# Patient Record
Sex: Female | Born: 1950 | Race: White | Hispanic: No | Smoking: Never smoker
Health system: Southern US, Community
[De-identification: ages and names within clinical notes are randomized; demographics above are authoritative.]

## PROBLEM LIST (undated history)

## (undated) DIAGNOSIS — Z87442 Personal history of urinary calculi: Secondary | ICD-10-CM

## (undated) DIAGNOSIS — E0590 Thyrotoxicosis, unspecified without thyrotoxic crisis or storm: Secondary | ICD-10-CM

## (undated) DIAGNOSIS — T7840XA Allergy, unspecified, initial encounter: Secondary | ICD-10-CM

## (undated) DIAGNOSIS — M1712 Unilateral primary osteoarthritis, left knee: Secondary | ICD-10-CM

## (undated) DIAGNOSIS — Z8489 Family history of other specified conditions: Secondary | ICD-10-CM

## (undated) DIAGNOSIS — R1310 Dysphagia, unspecified: Secondary | ICD-10-CM

## (undated) DIAGNOSIS — R55 Syncope and collapse: Secondary | ICD-10-CM

## (undated) DIAGNOSIS — E049 Nontoxic goiter, unspecified: Secondary | ICD-10-CM

## (undated) DIAGNOSIS — K219 Gastro-esophageal reflux disease without esophagitis: Secondary | ICD-10-CM

## (undated) DIAGNOSIS — M1990 Unspecified osteoarthritis, unspecified site: Secondary | ICD-10-CM

## (undated) DIAGNOSIS — D62 Acute posthemorrhagic anemia: Secondary | ICD-10-CM

## (undated) DIAGNOSIS — H269 Unspecified cataract: Secondary | ICD-10-CM

## (undated) HISTORY — PX: SPINE SURGERY: SHX786

## (undated) HISTORY — PX: ABDOMINAL HYSTERECTOMY: SHX81

## (undated) HISTORY — DX: Unspecified osteoarthritis, unspecified site: M19.90

## (undated) HISTORY — DX: Allergy, unspecified, initial encounter: T78.40XA

## (undated) HISTORY — PX: APPENDECTOMY: SHX54

## (undated) HISTORY — PX: FRACTURE SURGERY: SHX138

## (undated) HISTORY — PX: DILATION AND CURETTAGE OF UTERUS: SHX78

## (undated) HISTORY — PX: BACK SURGERY: SHX140

## (undated) HISTORY — PX: TONSILLECTOMY: SUR1361

## (undated) HISTORY — PX: BREAST EXCISIONAL BIOPSY: SUR124

---
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
## 1980-11-22 HISTORY — PX: APPENDECTOMY: SHX54

## 1980-11-22 HISTORY — PX: ABDOMINAL HYSTERECTOMY: SHX81

## 1993-11-22 HISTORY — PX: FRACTURE SURGERY: SHX138

## 2013-10-16 ENCOUNTER — Ambulatory Visit: Payer: Self-pay | Admitting: Family Medicine

## 2013-10-16 VITALS — BP 128/78 | HR 78 | Temp 97.9°F | Resp 16 | Ht 63.75 in | Wt 154.8 lb

## 2013-10-16 DIAGNOSIS — Z1239 Encounter for other screening for malignant neoplasm of breast: Secondary | ICD-10-CM

## 2013-10-16 NOTE — Progress Notes (Signed)
[facility] [address] [place] [address] [phone]  Date:  10/16/2013   Name:  Maria Bautista   DOB:  May 25, 1951   MRN:  [id number]  PCP:  No primary provider on file.    Chief Complaint: Follow-up   History of Present Illness:  Maria Bautista is a 62 y.o. very pleasant female patient who presents with the following:  She is here today for a follow-up of a breast issue.  She recently moved here from [place] and is not yet established with a PCP.  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    She had a mild abnl on her mammogram- right breast- last year (October 2013) and followed up 6 months later (April 2013); follow-up was ok.  They told her to have a follow-up in 6 months which is now.   She did not have a diagnostic mammogram- just a regular.  She has never had breast cancer.  She did have a cyst removed from her right breast several years ago but it was benign  She is otherwise generally healthy   There are no active problems to display for this patient.   Past Medical History  Diagnosis Date  . Allergy   . Arthritis     Past Surgical History  Procedure Laterality Date  . Appendectomy    . Cesarean section    . Fracture surgery    . Abdominal hysterectomy    . Spine surgery      History  Substance Use Topics  . Smoking status: Never Smoker   . Smokeless tobacco: Not on file  . Alcohol Use: No    Family History  Problem Relation Age of Onset  . Heart disease Mother   . Hyperlipidemia Mother   . Diabetes Father     Not on File  Medication list has been reviewed and updated.  No current outpatient prescriptions on file prior to visit.   No current facility-administered medications on file prior to visit.    Review of Systems:  As per HPI- otherwise negative.   Physical Examination: Filed Vitals:   10/16/13 1215  BP: 128/78  Pulse: 78  Temp: 97.9 F (36.6 C)  Resp: 16   Filed  Vitals:   10/16/13 1215  Height: 5' 3.75" (1.619 m)  Weight: 154 lb 12.8 oz (70.217 kg)   Body mass index is 26.79 kg/(m^2). Ideal Body Weight: Weight in (lb) to have BMI = 25: 144.2  GEN: WDWN, NAD, Non-toxic, A & O x 3 HEENT: Atraumatic, Normocephalic. Neck supple. No masses, No LAD. Ears and Nose: No external deformity. CV: RRR, No M/G/R. No JVD. No thrill. No extra heart sounds. PULM: CTA B, no wheezes, crackles, rhonchi. No retractions. No resp. distress. No accessory muscle use. ABD: S, NT, ND, +BS. No rebound. No HSM. EXTR: No c/c/e NEURO Normal gait.  PSYCH: Normally interactive. Conversant. Not depressed or anxious appearing.  Calm demeanor.  Breast: normal exam bilaterally.  No masses, discharge or dimpling  Assessment and Plan: Breast cancer screening - Plan: MM Digital Screening  Made referral for mammogram. Also gave info regarding [facility] scholarship fund as she may qualify for this service.   Signed Abbe Amsterdam, MD

## 2013-10-16 NOTE — Patient Instructions (Signed)
Please let us know if you have any problems with your mammogram

## 2013-11-12 ENCOUNTER — Other Ambulatory Visit (HOSPITAL_COMMUNITY): Payer: Self-pay | Admitting: *Deleted

## 2013-11-12 DIAGNOSIS — Z853 Personal history of malignant neoplasm of breast: Secondary | ICD-10-CM

## 2013-11-20 ENCOUNTER — Ambulatory Visit (HOSPITAL_COMMUNITY)
Admission: RE | Admit: 2013-11-20 | Discharge: 2013-11-20 | Disposition: A | Discharge status: Home / Self Care | Payer: Self-pay | Source: Ambulatory Visit | Attending: Obstetrics and Gynecology | Admitting: Obstetrics and Gynecology

## 2013-11-20 ENCOUNTER — Encounter (INDEPENDENT_AMBULATORY_CARE_PROVIDER_SITE_OTHER): Payer: Self-pay

## 2013-11-20 ENCOUNTER — Encounter (HOSPITAL_COMMUNITY): Payer: Self-pay

## 2013-11-20 VITALS — BP 124/62 | Temp 98.2°F | Ht 64.0 in | Wt 153.2 lb

## 2013-11-20 DIAGNOSIS — Z1239 Encounter for other screening for malignant neoplasm of breast: Secondary | ICD-10-CM

## 2013-11-20 NOTE — Patient Instructions (Addendum)
Taught Maria Bautista how to perform BSE and gave educational materials to take home. Patient did not need a Pap smear today due to last Pap smear due to a history of a hysterectomy for benign reasons. Let patient know that she no longer needs Pap smears due to her history of a hysterectomy for benign reasons. Referred patient to the [facility] for diagnostic mammogram per recommendation. Appointment scheduled for Tuesday November 27, 2013 at 1400. Referred patient to the [facility] program. Appointment scheduled for Friday, December 07, 2013 at 1100. Patient aware of appointments and will be there. Patient has already had images from [place] sent to the [facility] [place] and has verified that they have been received. Maria Bautista verbalized understanding.  Camora Tremain, Kathaleen Maser, RN 4:10 PM

## 2013-11-20 NOTE — Progress Notes (Signed)
Patient is needing a 6 month follow up diagnostic mammogram per recommendation. Last diagnostic mammogram was March 15, 2013 in [place] recommending short term 6 month follow up.  Pap Smear:    Pap smear not completed today. Last Pap smear was November 2011 and normal per patient. Per patient has no history of an abnormal Pap smear. Patient has a history of a hysterectomy in 1982 for DUB with only one ovary remaining. No further Pap smears are needed due to her history of a hysterectomy for benign reasons. No Pap smear results in EPIC.  Physical exam: Breasts Breasts symmetrical. No skin abnormalities bilateral breasts. No nipple retraction bilateral breasts. No nipple discharge bilateral breasts. No lymphadenopathy. No lumps palpated bilateral breasts. No complaints of pain or tenderness on exam. Referred patient to the [facility] for diagnostic mammogram per recommendation. Appointment scheduled for Tuesday November 27, 2013 at 1400.  Pelvic/Bimanual No Pap smear completed today since has a history of a hysterectomy for benign reasons. Pap smear not indicated per BCCCP guidelines.

## 2013-11-20 NOTE — Addendum Note (Signed)
Encounter addended by: Saintclair Halsted, RN on: 11/20/2013  4:22 PM<BR>     Documentation filed: Visit Diagnoses

## 2013-11-27 ENCOUNTER — Ambulatory Visit
Admission: RE | Admit: 2013-11-27 | Discharge: 2013-11-27 | Disposition: A | Discharge status: Home / Self Care | Payer: No Typology Code available for payment source | Source: Ambulatory Visit | Attending: Obstetrics and Gynecology | Admitting: Obstetrics and Gynecology

## 2013-11-27 ENCOUNTER — Other Ambulatory Visit (HOSPITAL_COMMUNITY): Payer: Self-pay | Admitting: Obstetrics and Gynecology

## 2013-11-27 DIAGNOSIS — Z853 Personal history of malignant neoplasm of breast: Secondary | ICD-10-CM

## 2013-12-07 ENCOUNTER — Encounter (INDEPENDENT_AMBULATORY_CARE_PROVIDER_SITE_OTHER): Payer: Self-pay

## 2013-12-07 ENCOUNTER — Ambulatory Visit: Payer: Self-pay

## 2013-12-07 ENCOUNTER — Ambulatory Visit (HOSPITAL_BASED_OUTPATIENT_CLINIC_OR_DEPARTMENT_OTHER): Payer: Self-pay | Admitting: *Deleted

## 2013-12-07 VITALS — BP 118/78 | HR 82 | Temp 98.1°F | Resp 16 | Ht 63.25 in | Wt 149.7 lb

## 2013-12-07 DIAGNOSIS — Z0000 Encounter for general adult medical examination without abnormal findings: Secondary | ICD-10-CM

## 2013-12-07 LAB — LIPID PANEL
Cholesterol: 169 mg/dL (ref 0–200)
HDL: 50 mg/dL (ref >39)
LDL Cholesterol: 105 mg/dL — ABNORMAL HIGH (ref 0–99)
Total CHOL/HDL Ratio: 3.4 Ratio
Triglycerides: 70 mg/dL (ref <150)
VLDL: 14 mg/dL (ref 0–40)

## 2013-12-07 LAB — HEMOGLOBIN A1C
Hgb A1c MFr Bld: 5.7 % — ABNORMAL HIGH (ref <5.7)
Mean Plasma Glucose: 117 mg/dL — ABNORMAL HIGH (ref <117)

## 2013-12-07 LAB — GLUCOSE (CC13): Glucose: 97 mg/dl (ref 70–140)

## 2013-12-07 NOTE — Patient Instructions (Signed)
Discussed health assessment with patient.Discussed that mental health issues concerning separation, she has under control at the present time. If was to have problems we discussed options available. Patient verbalized understanding.

## 2013-12-07 NOTE — Progress Notes (Signed)
Patient is a new patient to the [facility] program and is currently a BCCCP patient effective 10/16/2013.   Clinical Measurements: Patient is 5 ft. 3.25 inches, weight 149.7 lbs, waist circumference 332.5 inches, and hip circumference 40 inches.   Medical History: Patient states she does have a history of high cholesterol and doesn't take any medications now, because last time checked it was normal. Patient does not have a history of hypertension or diabetes and does not take any medications. Per patient no diagnosed history of coronary heart disease, heart attack, heart failure, stroke/TIA, vascular disease or congenital heart defects. Patientt states that will have health insurance in a month or two  Blood Pressure, Self-measurement: Patient does not have any history of hypertension and does not need to know how to check BP at the present time.  Nutrition Assessment: Patient states  she loves fruit and has at least two to three cups of fruit a day.Patient states she only eats a half cup of vegetables daily. Patient states does eat 3 or more ounces of whole grains daily. Patient doesn't eat two or more servings of fish weekly. Patient does state she Does not drink more than 36 ounces or 450 calories of beverages with added sugars weekly. Patient stated she does not watch her salt intake.   Physical Activity Assessment: Patient stated she does around 180 minutes of moderate exercise weekly including walking and household chores. Patient stated she does a 180 minutes of vigorous physical activity on a regular basis per week.   Smoking Status: Patient has never smoked.  Quality of Life Assessment: In assessing patient's quality of life she stated that out of the past 30 days that she has felt her health is good . Patient also stated that in the past 30 days that her mental health ihas not been good 2 times due to thinking about things in [place] and going back. Patient is fairly recently separated from  husband and still loves him, but can not live in same situation. Patient did state that out of the past 30 days that she not felt any physical or mental health that would keep her from doing her usual activities including self-care, work or recreation.   Plan: Lab work will be done today including a lipid panel, glucose and Hgb A1c. Will follow up with patient concerning lab work. If needed will refer to [facility] for abnormal labs or do health coaching.  Patient examined and note written by Carlean Jews, RN, BSN

## 2013-12-11 ENCOUNTER — Telehealth (HOSPITAL_COMMUNITY): Payer: Self-pay

## 2013-12-11 NOTE — Telephone Encounter (Signed)
Called and informed the patient of her lab work: Bld Glucose - 97, HgbA1C - 5.7, mean - 117, Cholesterol - 169, HDL - 50, LDL - 105, Triglycerides - 70. We discussed watching Carbohydrates and sweets. Patient will get a PCP and have her HbgA1C checked.  Stated that understood and will call if has any problems.

## 2014-05-13 ENCOUNTER — Ambulatory Visit (INDEPENDENT_AMBULATORY_CARE_PROVIDER_SITE_OTHER): Payer: 59 | Admitting: Family Medicine

## 2014-05-13 ENCOUNTER — Other Ambulatory Visit: Payer: Self-pay | Admitting: Family Medicine

## 2014-05-13 ENCOUNTER — Encounter: Payer: Self-pay | Admitting: Family Medicine

## 2014-05-13 VITALS — BP 122/80 | HR 97 | Temp 98.0°F | Resp 16 | Ht 62.5 in | Wt 148.0 lb

## 2014-05-13 DIAGNOSIS — R921 Mammographic calcification found on diagnostic imaging of breast: Secondary | ICD-10-CM

## 2014-05-13 DIAGNOSIS — E875 Hyperkalemia: Secondary | ICD-10-CM

## 2014-05-13 DIAGNOSIS — Z1329 Encounter for screening for other suspected endocrine disorder: Secondary | ICD-10-CM

## 2014-05-13 DIAGNOSIS — R0982 Postnasal drip: Secondary | ICD-10-CM

## 2014-05-13 DIAGNOSIS — R739 Hyperglycemia, unspecified: Secondary | ICD-10-CM

## 2014-05-13 DIAGNOSIS — R7309 Other abnormal glucose: Secondary | ICD-10-CM

## 2014-05-13 LAB — COMPREHENSIVE METABOLIC PANEL
ALT: 20 U/L (ref 0–35)
AST: 23 U/L (ref 0–37)
Albumin: 4.5 g/dL (ref 3.5–5.2)
Alkaline Phosphatase: 63 U/L (ref 39–117)
BUN: 8 mg/dL (ref 6–23)
CO2: 27 mEq/L (ref 19–32)
Calcium: 10.4 mg/dL (ref 8.4–10.5)
Chloride: 100 mEq/L (ref 96–112)
Creat: 0.78 mg/dL (ref 0.50–1.10)
Glucose, Bld: 101 mg/dL — ABNORMAL HIGH (ref 70–99)
Potassium: 5.4 mEq/L — ABNORMAL HIGH (ref 3.5–5.3)
Sodium: 133 mEq/L — ABNORMAL LOW (ref 135–145)
Total Bilirubin: 0.4 mg/dL (ref 0.2–1.2)
Total Protein: 7.1 g/dL (ref 6.0–8.3)

## 2014-05-13 LAB — CBC
HCT: 37.3 % (ref 36.0–46.0)
Hemoglobin: 12.5 g/dL (ref 12.0–15.0)
MCH: 28.7 pg (ref 26.0–34.0)
MCHC: 33.5 g/dL (ref 30.0–36.0)
MCV: 85.7 fL (ref 78.0–100.0)
Platelets: 364 10*3/uL (ref 150–400)
RBC: 4.35 MIL/uL (ref 3.87–5.11)
RDW: 12.8 % (ref 11.5–15.5)
WBC: 4.8 10*3/uL (ref 4.0–10.5)

## 2014-05-13 LAB — HEMOGLOBIN A1C
Hgb A1c MFr Bld: 5.8 % — ABNORMAL HIGH (ref <5.7)
Mean Plasma Glucose: 120 mg/dL — ABNORMAL HIGH (ref <117)

## 2014-05-13 LAB — TSH: TSH: 0.361 u[IU]/mL (ref 0.350–4.500)

## 2014-05-13 MED ORDER — IPRATROPIUM BROMIDE 0.03 % NA SOLN: 2.0000 | Freq: Four times a day (QID) | NASAL | Status: DC

## 2014-05-13 NOTE — Patient Instructions (Signed)
Great to see you today I will be in touch with your labs Go ahead and call to set up your repeat mammogram Try the atrovent nasal spray for your allergies- let me know if not helpful for you

## 2014-05-13 NOTE — Progress Notes (Signed)
[facility] [address], [place] [address] [phone]  Date:  05/13/2014   Name:  Maria Bautista   DOB:  August 05, 1951   MRN:  [id number]  PCP:  No primary provider on file.    Chief Complaint: Advice Only   History of Present Illness:  Maria Bautista is a 63 y.o. very pleasant female patient who presents with the following:  She is here today to follow-up her mammogram- she had one in January and needs a follow-up diagnostic.   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  She has used claritin and claritin D.  Right now she is using zyrtec in the am and claritin at night.   She has PND, some drainage, little sneezing. She will have a horse voice some of the time.  "my nose will run like a river.'   She takes spironolactone for her hair She is not fasting today- she had a FLP about 6 months ago and her A1c was 5.7% at that time.    There are no active problems to display for this patient.   Past Medical History  Diagnosis Date  . Allergy   . Arthritis     Past Surgical History  Procedure Laterality Date  . Appendectomy    . Cesarean section    . Fracture surgery    . Abdominal hysterectomy    . Spine surgery      History  Substance Use Topics  . Smoking status: Never Smoker   . Smokeless tobacco: Not on file  . Alcohol Use: No    Family History  Problem Relation Age of Onset  . Heart disease Mother   . Hyperlipidemia Mother   . Diabetes Father     Allergies  Allergen Reactions  . Codeine     Medication list has been reviewed and updated.  Current Outpatient Prescriptions on File Prior to Visit  Medication Sig Dispense Refill  . aspirin 81 MG tablet Take 81 mg by mouth daily.      . cetirizine-pseudoephedrine (ZYRTEC-D) 5-120 MG per tablet Take 1 tablet by mouth 2 (two) times daily.      Marland Kitchen omeprazole (PRILOSEC) 10 MG capsule Take 10 mg by mouth daily.      Marland Kitchen spironolactone (ALDACTONE) 100 MG tablet Take 100 mg by mouth daily.        No current facility-administered medications on file prior to visit.    Review of Systems:  As per HPI- otherwise negative.   Physical Examination: Filed Vitals:   05/13/14 0930  BP: 122/80  Pulse: 97  Temp: 98 F (36.7 C)  Resp: 16   Filed Vitals:   05/13/14 0930  Height: 5' 2.5" (1.588 m)  Weight: 148 lb (67.132 kg)   Body mass index is 26.62 kg/(m^2). Ideal Body Weight: Weight in (lb) to have BMI = 25: 138.6  GEN: WDWN, NAD, Non-toxic, A & O x , looks well, mild overweight HEENT: Atraumatic, Normocephalic. Neck supple. No masses, No LAD.  Bilateral TM wnl, oropharynx normal.  PEERL,EOMI.   Stringy nasal mucus typical of AR Ears and Nose: No external deformity. CV: RRR, No M/G/R. No JVD. No thrill. No extra heart sounds. PULM: CTA B, no wheezes, crackles, rhonchi. No retractions. No resp. distress. No accessory muscle use. EXTR: No c/c/e NEURO Normal gait.  PSYCH: Normally interactive. Conversant. Not depressed or anxious appearing.  Calm demeanor.    Assessment and Plan: PND (post-nasal drip) - Plan: ipratropium (ATROVENT) 0.03 %  nasal spray  Elevated blood sugar - Plan: CBC, Comprehensive metabolic panel, Hemoglobin A1c  Screening for hypothyroidism - Plan: TSH  Per letter from her imaging facility she can call to schedule FU.  She will do so Added atrovent nasal for what sounds like chronic PND.  She will let me know if not better.   Signed Lamar Blinks, MD

## 2014-05-14 ENCOUNTER — Encounter: Payer: Self-pay | Admitting: Family Medicine

## 2014-05-14 NOTE — Addendum Note (Signed)
Addended by: Lamar Blinks C on: 05/14/2014 02:14 PM   Modules accepted: Orders

## 2014-05-28 ENCOUNTER — Ambulatory Visit
Admission: RE | Admit: 2014-05-28 | Discharge: 2014-05-28 | Disposition: A | Discharge status: Home / Self Care | Payer: Self-pay | Source: Ambulatory Visit | Attending: Family Medicine | Admitting: Family Medicine

## 2014-05-28 ENCOUNTER — Other Ambulatory Visit: Payer: Self-pay | Admitting: Family Medicine

## 2014-05-28 DIAGNOSIS — R921 Mammographic calcification found on diagnostic imaging of breast: Secondary | ICD-10-CM

## 2014-09-23 ENCOUNTER — Encounter: Payer: Self-pay | Admitting: Family Medicine

## 2014-12-03 ENCOUNTER — Other Ambulatory Visit: Payer: Self-pay | Admitting: Family Medicine

## 2014-12-03 DIAGNOSIS — Z1231 Encounter for screening mammogram for malignant neoplasm of breast: Secondary | ICD-10-CM

## 2014-12-17 ENCOUNTER — Other Ambulatory Visit: Payer: Self-pay | Admitting: Family Medicine

## 2014-12-17 DIAGNOSIS — R921 Mammographic calcification found on diagnostic imaging of breast: Secondary | ICD-10-CM

## 2014-12-18 ENCOUNTER — Inpatient Hospital Stay: Admission: RE | Admit: 2014-12-18 | Payer: Self-pay | Source: Ambulatory Visit

## 2014-12-25 ENCOUNTER — Ambulatory Visit
Admission: RE | Admit: 2014-12-25 | Discharge: 2014-12-25 | Disposition: A | Discharge status: Home / Self Care | Payer: Medicare Other | Source: Ambulatory Visit | Attending: Family Medicine | Admitting: Family Medicine

## 2014-12-25 DIAGNOSIS — R921 Mammographic calcification found on diagnostic imaging of breast: Secondary | ICD-10-CM | POA: Diagnosis not present

## 2015-02-17 ENCOUNTER — Encounter: Payer: Self-pay | Admitting: Family Medicine

## 2015-02-17 ENCOUNTER — Ambulatory Visit (INDEPENDENT_AMBULATORY_CARE_PROVIDER_SITE_OTHER): Payer: Medicare Other | Admitting: Family Medicine

## 2015-02-17 VITALS — BP 110/72 | HR 82 | Temp 98.0°F | Resp 16 | Ht 64.0 in | Wt 145.2 lb

## 2015-02-17 DIAGNOSIS — L659 Nonscarring hair loss, unspecified: Secondary | ICD-10-CM | POA: Insufficient documentation

## 2015-02-17 DIAGNOSIS — E785 Hyperlipidemia, unspecified: Secondary | ICD-10-CM | POA: Diagnosis not present

## 2015-02-17 DIAGNOSIS — R0982 Postnasal drip: Secondary | ICD-10-CM | POA: Diagnosis not present

## 2015-02-17 DIAGNOSIS — Z13220 Encounter for screening for lipoid disorders: Secondary | ICD-10-CM | POA: Diagnosis not present

## 2015-02-17 DIAGNOSIS — E875 Hyperkalemia: Secondary | ICD-10-CM

## 2015-02-17 DIAGNOSIS — R739 Hyperglycemia, unspecified: Secondary | ICD-10-CM | POA: Diagnosis not present

## 2015-02-17 LAB — LIPID PANEL
Cholesterol: 199 mg/dL (ref 0–200)
HDL: 54 mg/dL (ref >=46)
LDL Cholesterol: 128 mg/dL — ABNORMAL HIGH (ref 0–99)
Total CHOL/HDL Ratio: 3.7 Ratio
Triglycerides: 84 mg/dL (ref <150)
VLDL: 17 mg/dL (ref 0–40)

## 2015-02-17 LAB — BASIC METABOLIC PANEL
BUN: 11 mg/dL (ref 6–23)
CO2: 27 mEq/L (ref 19–32)
Calcium: 10.4 mg/dL (ref 8.4–10.5)
Chloride: 103 mEq/L (ref 96–112)
Creat: 0.67 mg/dL (ref 0.50–1.10)
Glucose, Bld: 103 mg/dL — ABNORMAL HIGH (ref 70–99)
Potassium: 5 mEq/L (ref 3.5–5.3)
Sodium: 138 mEq/L (ref 135–145)

## 2015-02-17 LAB — HEMOGLOBIN A1C
Hgb A1c MFr Bld: 5.7 % — ABNORMAL HIGH (ref <5.7)
Mean Plasma Glucose: 117 mg/dL — ABNORMAL HIGH (ref <117)

## 2015-02-17 MED ORDER — IPRATROPIUM BROMIDE 0.03 % NA SOLN: 2.0000 | Freq: Four times a day (QID) | NASAL | Status: DC

## 2015-02-17 MED ORDER — SPIRONOLACTONE 100 MG PO TABS: 100.0000 mg | ORAL_TABLET | Freq: Every day | ORAL | Status: DC

## 2015-02-17 NOTE — Patient Instructions (Signed)
Good to see you today- I will be in touch with your labs asap 

## 2015-02-17 NOTE — Progress Notes (Signed)
[facility] [address], [place] [address] [phone]  Date:  02/17/2015   Name:  Maria Bautista   DOB:  [birth year]   MRN:  [id number]  PCP:  No primary care provider on file.    Chief Complaint: blood work and Advice Only   History of Present Illness:  Maria Bautista is a 64 y.o. very pleasant female patient who presents with the following:  She is here today to recheck her labs- we needed to recheck her K and her A1c.  She is on spiro for hair loss; she started this a couple of years and and it did seem to help her keep more of her hair.  However her K was a bit elevated last year and needs recheck today.  Her A1c was borderline as well  Lab Results  Component Value Date   HGBA1C 5.8* 05/13/2014    She was given atrovent nasal last year which did seem to help her- she would like to have more of this She is fasting today and would like to check her CHL  There are no active problems to display for this patient.   Past Medical History  Diagnosis Date  . Allergy   . Arthritis     Past Surgical History  Procedure Laterality Date  . Appendectomy    . Cesarean section    . Fracture surgery    . Abdominal hysterectomy    . Spine surgery      History  Substance Use Topics  . Smoking status: Never Smoker   . Smokeless tobacco: Not on file  . Alcohol Use: No    Family History  Problem Relation Age of Onset  . Heart disease Mother   . Hyperlipidemia Mother   . Diabetes Father     Allergies  Allergen Reactions  . Codeine     Medication list has been reviewed and updated.  Current Outpatient Prescriptions on File Prior to Visit  Medication Sig Dispense Refill  . Ascorbic Acid (VITAMIN C) 100 MG tablet Take 100 mg by mouth daily.    Marland Kitchen aspirin 81 MG tablet Take 81 mg by mouth daily.    . calcium carbonate (OS-CAL) 600 MG TABS tablet Take 600 mg by mouth 2 (two) times daily with a meal.    . cetirizine-pseudoephedrine  (ZYRTEC-D) 5-120 MG per tablet Take 1 tablet by mouth 2 (two) times daily.    . cholecalciferol (VITAMIN D) 1000 UNITS tablet Take 1,000 Units by mouth daily.    Marland Kitchen ipratropium (ATROVENT) 0.03 % nasal spray Place 2 sprays into the nose 4 (four) times daily. 30 mL 6  . lactobacillus acidophilus (BACID) TABS tablet Take 2 tablets by mouth 3 (three) times daily.    Marland Kitchen loratadine (CLARITIN) 10 MG tablet Take 10 mg by mouth daily.    Marland Kitchen omeprazole (PRILOSEC) 10 MG capsule Take 10 mg by mouth daily.    Marland Kitchen spironolactone (ALDACTONE) 100 MG tablet Take 100 mg by mouth daily.     No current facility-administered medications on file prior to visit.    Review of Systems:  As per HPI- otherwise negative.   Physical Examination: Filed Vitals:   02/17/15 0809  BP: 110/72  Pulse: 82  Temp: 98 F (36.7 C)  Resp: 16   Filed Vitals:   02/17/15 0809  Height: 5\' 4"  (1.626 m)  Weight: 145 lb 3.2 oz (65.862 kg)   Body mass index is 24.91 kg/(m^2). Ideal Body Weight: Weight  in (lb) to have BMI = 25: 145.3  GEN: WDWN, NAD, Non-toxic, A & O x 3, looks well, normal weight HEENT: Atraumatic, Normocephalic. Neck supple. No masses, No LAD. Ears and Nose: No external deformity. CV: RRR, No M/G/R. No JVD. No thrill. No extra heart sounds. PULM: CTA B, no wheezes, crackles, rhonchi. No retractions. No resp. distress. No accessory muscle use. EXTR: No c/c/e NEURO Normal gait.  PSYCH: Normally interactive. Conversant. Not depressed or anxious appearing.  Calm demeanor.    Assessment and Plan: Borderline hyperglycemia - Plan: Hemoglobin A1c  Hyperkalemia - Plan: Basic metabolic panel  PND (post-nasal drip) - Plan: ipratropium (ATROVENT) 0.03 % nasal spray  Thinning hair - Plan: spironolactone (ALDACTONE) 100 MG tablet  Screening for hyperlipidemia - Plan: Lipid panel  Refilled her spiro and atrovent nasal Will plan further follow- up pending labs. Explained that spiro is a k sparing diuretic; thus  can increase her K level  Signed Lamar Blinks, MD

## 2015-02-28 ENCOUNTER — Encounter: Payer: Self-pay | Admitting: Family Medicine

## 2015-04-03 ENCOUNTER — Ambulatory Visit (INDEPENDENT_AMBULATORY_CARE_PROVIDER_SITE_OTHER): Payer: Medicare Other | Admitting: Physician Assistant

## 2015-04-03 ENCOUNTER — Ambulatory Visit (INDEPENDENT_AMBULATORY_CARE_PROVIDER_SITE_OTHER): Payer: Medicare Other

## 2015-04-03 VITALS — BP 132/74 | HR 98 | Temp 98.0°F | Resp 13 | Ht 63.0 in | Wt 149.6 lb

## 2015-04-03 DIAGNOSIS — R05 Cough: Secondary | ICD-10-CM | POA: Diagnosis not present

## 2015-04-03 DIAGNOSIS — J019 Acute sinusitis, unspecified: Secondary | ICD-10-CM | POA: Diagnosis not present

## 2015-04-03 DIAGNOSIS — R058 Other specified cough: Secondary | ICD-10-CM

## 2015-04-03 LAB — POCT CBC
Granulocyte percent: 77.5 %G (ref 37–80)
HCT, POC: 39.4 % (ref 37.7–47.9)
Hemoglobin: 12.7 g/dL (ref 12.2–16.2)
Lymph, poc: 1.9 (ref 0.6–3.4)
MCH, POC: 28 pg (ref 27–31.2)
MCHC: 32.2 g/dL (ref 31.8–35.4)
MCV: 87 fL (ref 80–97)
MID (cbc): 0.6 (ref 0–0.9)
MPV: 6.8 fL (ref 0–99.8)
POC Granulocyte: 8.4 — AB (ref 2–6.9)
POC LYMPH PERCENT: 17.3 %L (ref 10–50)
POC MID %: 5.2 %M (ref 0–12)
Platelet Count, POC: 413 10*3/uL (ref 142–424)
RBC: 4.53 M/uL (ref 4.04–5.48)
RDW, POC: 13 %
WBC: 10.8 10*3/uL — AB (ref 4.6–10.2)

## 2015-04-03 MED ORDER — LEVOFLOXACIN 500 MG PO TABS: 500.0000 mg | ORAL_TABLET | Freq: Every day | ORAL | Status: AC

## 2015-04-03 NOTE — Progress Notes (Signed)
[facility] and [facility] [address] [place] [address] [phone]  Date:  04/03/2015   Name:  Maria Bautista   DOB:  09-10-1951   MRN:  [id number]  PCP:  No primary care provider on file.    Chief Complaint: Nasal Congestion; Cough; and Allergies   History of Present Illness:  Maria Bautista is a 64 y.o. very pleasant female patient who presents with the following:  Patient is complaining of 2 weeks of sinus pressure, and a progressive worsening cough of a green thick sputum.  She has nasal pressure and rhinorrhea.  Cough is keeping her up at night.  She feels fatigue, but no fever or chills.  She has tried alka seltzer cold and sinus, and zyrtec which has helped.  She has no GI symptoms of nausea, vomiting, or diarrhea.   Patient Active Problem List   Diagnosis Date Noted  . Thinning hair 02/17/2015  . Borderline hyperglycemia 02/17/2015    Past Medical History  Diagnosis Date  . Allergy   . Arthritis     Past Surgical History  Procedure Laterality Date  . Appendectomy    . Cesarean section    . Fracture surgery    . Abdominal hysterectomy    . Spine surgery      History  Substance Use Topics  . Smoking status: Never Smoker   . Smokeless tobacco: Not on file  . Alcohol Use: No    Family History  Problem Relation Age of Onset  . Heart disease Mother   . Hyperlipidemia Mother   . Diabetes Father     Allergies  Allergen Reactions  . Codeine     Medication list has been reviewed and updated.  Current Outpatient Prescriptions on File Prior to Visit  Medication Sig Dispense Refill  . Ascorbic Acid (VITAMIN C) 100 MG tablet Take 100 mg by mouth daily.    Marland Kitchen aspirin 81 MG tablet Take 81 mg by mouth daily.    . calcium carbonate (OS-CAL) 600 MG TABS tablet Take 600 mg by mouth 2 (two) times daily with a meal.    . cetirizine-pseudoephedrine (ZYRTEC-D) 5-120 MG per tablet Take 1 tablet by mouth 2 (two) times daily.    . cholecalciferol (VITAMIN  D) 1000 UNITS tablet Take 1,000 Units by mouth daily.    Marland Kitchen ipratropium (ATROVENT) 0.03 % nasal spray Place 2 sprays into the nose 4 (four) times daily. 30 mL 6  . lactobacillus acidophilus (BACID) TABS tablet Take 2 tablets by mouth 3 (three) times daily.    Marland Kitchen loratadine (CLARITIN) 10 MG tablet Take 10 mg by mouth daily.    Marland Kitchen omeprazole (PRILOSEC) 10 MG capsule Take 10 mg by mouth daily.    Marland Kitchen spironolactone (ALDACTONE) 100 MG tablet Take 1 tablet (100 mg total) by mouth daily. 90 tablet 3   No current facility-administered medications on file prior to visit.    Review of Systems: ROS otherwise unremarkable unless listed above.  Physical Examination: Filed Vitals:   04/03/15 1551  BP: 132/74  Pulse: 98  Temp: 98 F (36.7 C)  Resp: 13   Filed Vitals:   04/03/15 1551  Height: 5\' 3"  (1.6 m)  Weight: 149 lb 9.6 oz (67.858 kg)   Body mass index is 26.51 kg/(m^2). Ideal Body Weight: Weight in (lb) to have BMI = 25: 140.8 Physical Exam  Constitutional: She is oriented to person, place, and time. She appears well-developed and well-nourished. No distress.  HENT:  Head:  Normocephalic and atraumatic.  Eyes: EOM are normal. Pupils are equal, round, and reactive to light.  Cardiovascular: Normal rate, regular rhythm and normal heart sounds.  Exam reveals no friction rub.   No murmur heard. Pulmonary/Chest: Effort normal and breath sounds normal. No respiratory distress. She has no decreased breath sounds. She has no wheezes. She has no rhonchi.  Lymphadenopathy:       Head (right side): No submandibular, no tonsillar, no preauricular, no posterior auricular and no occipital adenopathy present.       Head (left side): No submandibular, no tonsillar, no preauricular, no posterior auricular and no occipital adenopathy present.    She has no cervical adenopathy.       Right: No supraclavicular adenopathy present.       Left: No supraclavicular adenopathy present.  Neurological: She is alert  and oriented to person, place, and time.  Skin: Skin is warm and dry. She is not diaphoretic.  Psychiatric: She has a normal mood and affect. Her behavior is normal.    Results for orders placed or performed in visit on 04/03/15  POCT CBC  Result Value Ref Range   WBC 10.8 (A) 4.6 - 10.2 K/uL   Lymph, poc 1.9 0.6 - 3.4   POC LYMPH PERCENT 17.3 10 - 50 %L   MID (cbc) 0.6 0 - 0.9   POC MID % 5.2 0 - 12 %M   POC Granulocyte 8.4 (A) 2 - 6.9   Granulocyte percent 77.5 37 - 80 %G   RBC 4.53 4.04 - 5.48 M/uL   Hemoglobin 12.7 12.2 - 16.2 g/dL   HCT, POC 39.4 37.7 - 47.9 %   MCV 87.0 80 - 97 fL   MCH, POC 28.0 27 - 31.2 pg   MCHC 32.2 31.8 - 35.4 g/dL   RDW, POC 13.0 %   Platelet Count, POC 413 142 - 424 K/uL   MPV 6.8 0 - 99.8 fL   [facility] reading (PRIMARY) by  Dr. Ouida Sills: Normal  Assessment and Plan: 64 year old female is here today for chief complaint of productive cough, nasal pressure, and allergies for 2 weeks.   -Likely sinus infection of bacterial etiology, pneumonia.  Will treat for both.  She does not want anything for cough.  Offered delsym otc, and mucinex.  Productive cough - Plan: POCT CBC, DG Chest 2 View, levofloxacin (LEVAQUIN) 500 MG tablet qd for 7 days  Acute sinusitis, recurrence not specified, unspecified location - Plan: levofloxacin (LEVAQUIN) 500 MG tablet  Ivar Drape, PA-C [facility] 5/13/20161:53 PM

## 2015-04-03 NOTE — Patient Instructions (Signed)
Please hydrate with 64 oz of water (about 4 regular sized water bottles). Mucinex will thin out the mucus.   Sinusitis Sinusitis is redness, soreness, and inflammation of the paranasal sinuses. Paranasal sinuses are air pockets within the bones of your face (beneath the eyes, the middle of the forehead, or above the eyes). In healthy paranasal sinuses, mucus is able to drain out, and air is able to circulate through them by way of your nose. However, when your paranasal sinuses are inflamed, mucus and air can become trapped. This can allow bacteria and other germs to grow and cause infection. Sinusitis can develop quickly and last only a short time (acute) or continue over a long period (chronic). Sinusitis that lasts for more than 12 weeks is considered chronic.  CAUSES  Causes of sinusitis include:  Allergies.  Structural abnormalities, such as displacement of the cartilage that separates your nostrils (deviated septum), which can decrease the air flow through your nose and sinuses and affect sinus drainage.  Functional abnormalities, such as when the small hairs (cilia) that line your sinuses and help remove mucus do not work properly or are not present. SIGNS AND SYMPTOMS  Symptoms of acute and chronic sinusitis are the same. The primary symptoms are pain and pressure around the affected sinuses. Other symptoms include:  Upper toothache.  Earache.  Headache.  Bad breath.  Decreased sense of smell and taste.  A cough, which worsens when you are lying flat.  Fatigue.  Fever.  Thick drainage from your nose, which often is green and may contain pus (purulent).  Swelling and warmth over the affected sinuses. DIAGNOSIS  Your health care provider will perform a physical exam. During the exam, your health care provider may:  Look in your nose for signs of abnormal growths in your nostrils (nasal polyps).  Tap over the affected sinus to check for signs of infection.  View the  inside of your sinuses (endoscopy) using an imaging device that has a light attached (endoscope). If your health care provider suspects that you have chronic sinusitis, one or more of the following tests may be recommended:  Allergy tests.  Nasal culture. A sample of mucus is taken from your nose, sent to a lab, and screened for bacteria.  Nasal cytology. A sample of mucus is taken from your nose and examined by your health care provider to determine if your sinusitis is related to an allergy. TREATMENT  Most cases of acute sinusitis are related to a viral infection and will resolve on their own within 10 days. Sometimes medicines are prescribed to help relieve symptoms (pain medicine, decongestants, nasal steroid sprays, or saline sprays).  However, for sinusitis related to a bacterial infection, your health care provider will prescribe antibiotic medicines. These are medicines that will help kill the bacteria causing the infection.  Rarely, sinusitis is caused by a fungal infection. In theses cases, your health care provider will prescribe antifungal medicine. For some cases of chronic sinusitis, surgery is needed. Generally, these are cases in which sinusitis recurs more than 3 times per year, despite other treatments. HOME CARE INSTRUCTIONS   Drink plenty of water. Water helps thin the mucus so your sinuses can drain more easily.  Use a humidifier.  Inhale steam 3 to 4 times a day (for example, sit in the bathroom with the shower running).  Apply a warm, moist washcloth to your face 3 to 4 times a day, or as directed by your health care provider.  Use saline  nasal sprays to help moisten and clean your sinuses.  Take medicines only as directed by your health care provider.  If you were prescribed either an antibiotic or antifungal medicine, finish it all even if you start to feel better. SEEK IMMEDIATE MEDICAL CARE IF:  You have increasing pain or severe headaches.  You have  nausea, vomiting, or drowsiness.  You have swelling around your face.  You have vision problems.  You have a stiff neck.  You have difficulty breathing. MAKE SURE YOU:   Understand these instructions.  Will watch your condition.  Will get help right away if you are not doing well or get worse. Document Released: 11/08/2005 Document Revised: 03/25/2014 Document Reviewed: 11/23/2011 [facility] Patient Information 2015 [place], [place]. This information is not intended to replace advice given to you by your health care provider. Make sure you discuss any questions you have with your health care provider.

## 2015-04-08 NOTE — Progress Notes (Signed)
  Medical screening examination/treatment/procedure(s) were performed by non-physician practitioner and as supervising physician I was immediately available for consultation/collaboration.     

## 2015-05-13 ENCOUNTER — Telehealth: Payer: Self-pay

## 2015-05-13 DIAGNOSIS — M8580 Other specified disorders of bone density and structure, unspecified site: Secondary | ICD-10-CM

## 2015-05-13 NOTE — Telephone Encounter (Signed)
Patient wanted to let Dr Lorelei Pont know she got her shingles shot on 05/11/15 and her Pneumonia 13 shot on 04/13/15. She is requesting Dr copland to put in an order for her to get her bone Density. Patient is wanting to have it done between August 2-5th. Patients call back number is [phone]

## 2015-06-27 ENCOUNTER — Telehealth: Payer: Self-pay

## 2015-06-27 NOTE — Telephone Encounter (Signed)
Called patient to possibly re screen and found out that patient is now 56 and has medical insurance.

## 2015-06-30 ENCOUNTER — Ambulatory Visit
Admission: RE | Admit: 2015-06-30 | Discharge: 2015-06-30 | Disposition: A | Discharge status: Home / Self Care | Payer: Medicare Other | Source: Ambulatory Visit | Attending: Family Medicine | Admitting: Family Medicine

## 2015-06-30 ENCOUNTER — Encounter: Payer: Self-pay | Admitting: Family Medicine

## 2015-06-30 DIAGNOSIS — Z780 Asymptomatic menopausal state: Secondary | ICD-10-CM | POA: Diagnosis not present

## 2015-06-30 DIAGNOSIS — M8588 Other specified disorders of bone density and structure, other site: Secondary | ICD-10-CM | POA: Diagnosis not present

## 2015-06-30 DIAGNOSIS — M85852 Other specified disorders of bone density and structure, left thigh: Secondary | ICD-10-CM | POA: Diagnosis not present

## 2015-06-30 DIAGNOSIS — M8580 Other specified disorders of bone density and structure, unspecified site: Secondary | ICD-10-CM

## 2015-07-02 ENCOUNTER — Encounter: Payer: Self-pay | Admitting: Family Medicine

## 2015-07-09 DIAGNOSIS — H2513 Age-related nuclear cataract, bilateral: Secondary | ICD-10-CM | POA: Diagnosis not present

## 2015-07-29 ENCOUNTER — Ambulatory Visit (INDEPENDENT_AMBULATORY_CARE_PROVIDER_SITE_OTHER): Payer: Medicare Other | Admitting: Family Medicine

## 2015-07-29 VITALS — BP 122/64 | HR 86 | Temp 98.2°F | Resp 16 | Ht 62.5 in | Wt 150.6 lb

## 2015-07-29 DIAGNOSIS — R3915 Urgency of urination: Secondary | ICD-10-CM | POA: Diagnosis not present

## 2015-07-29 DIAGNOSIS — Z130 Encounter for screening for diseases of the blood and blood-forming organs and certain disorders involving the immune mechanism: Secondary | ICD-10-CM | POA: Diagnosis not present

## 2015-07-29 DIAGNOSIS — Z8639 Personal history of other endocrine, nutritional and metabolic disease: Secondary | ICD-10-CM

## 2015-07-29 DIAGNOSIS — Z711 Person with feared health complaint in whom no diagnosis is made: Secondary | ICD-10-CM

## 2015-07-29 DIAGNOSIS — D2262 Melanocytic nevi of left upper limb, including shoulder: Secondary | ICD-10-CM | POA: Diagnosis not present

## 2015-07-29 DIAGNOSIS — L814 Other melanin hyperpigmentation: Secondary | ICD-10-CM | POA: Diagnosis not present

## 2015-07-29 DIAGNOSIS — M1990 Unspecified osteoarthritis, unspecified site: Secondary | ICD-10-CM

## 2015-07-29 DIAGNOSIS — R7302 Impaired glucose tolerance (oral): Secondary | ICD-10-CM

## 2015-07-29 DIAGNOSIS — M19049 Primary osteoarthritis, unspecified hand: Secondary | ICD-10-CM

## 2015-07-29 LAB — CBC
HCT: 36.2 % (ref 36.0–46.0)
Hemoglobin: 12 g/dL (ref 12.0–15.0)
MCH: 28.5 pg (ref 26.0–34.0)
MCHC: 33.1 g/dL (ref 30.0–36.0)
MCV: 86 fL (ref 78.0–100.0)
MPV: 9.1 fL (ref 8.6–12.4)
Platelets: 315 10*3/uL (ref 150–400)
RBC: 4.21 MIL/uL (ref 3.87–5.11)
RDW: 13.2 % (ref 11.5–15.5)
WBC: 4.5 10*3/uL (ref 4.0–10.5)

## 2015-07-29 LAB — COMPREHENSIVE METABOLIC PANEL
ALT: 17 U/L (ref 6–29)
AST: 22 U/L (ref 10–35)
Albumin: 4.5 g/dL (ref 3.6–5.1)
Alkaline Phosphatase: 60 U/L (ref 33–130)
BUN: 17 mg/dL (ref 7–25)
CO2: 24 mmol/L (ref 20–31)
Calcium: 10.6 mg/dL — ABNORMAL HIGH (ref 8.6–10.4)
Chloride: 103 mmol/L (ref 98–110)
Creat: 0.7 mg/dL (ref 0.50–0.99)
Glucose, Bld: 92 mg/dL (ref 65–99)
Potassium: 5.1 mmol/L (ref 3.5–5.3)
Sodium: 135 mmol/L (ref 135–146)
Total Bilirubin: 0.5 mg/dL (ref 0.2–1.2)
Total Protein: 6.8 g/dL (ref 6.1–8.1)

## 2015-07-29 LAB — POCT URINALYSIS DIPSTICK
Bilirubin, UA: NEGATIVE
Glucose, UA: NEGATIVE
Nitrite, UA: NEGATIVE
Protein, UA: NEGATIVE
Spec Grav, UA: 1.02
Urobilinogen, UA: 0.2
pH, UA: 5

## 2015-07-29 LAB — POCT UA - MICROSCOPIC ONLY
Casts, Ur, LPF, POC: NEGATIVE
Crystals, Ur, HPF, POC: NEGATIVE
Mucus, UA: NEGATIVE
Yeast, UA: NEGATIVE

## 2015-07-29 LAB — HEMOGLOBIN A1C
Hgb A1c MFr Bld: 5.7 % — ABNORMAL HIGH (ref <5.7)
Mean Plasma Glucose: 117 mg/dL — ABNORMAL HIGH (ref <117)

## 2015-07-29 MED ORDER — OXYBUTYNIN CHLORIDE 5 MG PO TABS: 5.0000 mg | ORAL_TABLET | Freq: Two times a day (BID) | ORAL | Status: DC

## 2015-07-29 NOTE — Patient Instructions (Addendum)
I will be in touch with your skin biopsy results Please see [place] in about 10 days to remove the stitch from your arm If you have any sign of infection such as redness, heat, swelling let me know right away!    I will also be in touch with your labs We will refer you to a rheumatologist to make sure there is no concern for RA I do not see any concerning finding in your breast, but let me know if you notice any problem again  Try the ditropan as needed for your urinary urgency  WOUND CARE Please return in 10 days to have your stitches/staples removed or sooner if you have concerns. Marland Kitchen Keep area clean and dry for 24 hours. Do not remove bandage, if applied. . After 24 hours, remove bandage and wash wound gently with mild soap and warm water. Reapply a new bandage after cleaning wound, if directed. . Continue daily cleansing with soap and water until stitches/staples are removed. . Do not apply any ointments or creams to the wound while stitches/staples are in place, as this may cause delayed healing. . Notify the office if you experience any of the following signs of infection: Swelling, redness, pus drainage, streaking, fever >101.0 F . Notify the office if you experience excessive bleeding that does not stop after 15-20 minutes of constant, firm pressure.

## 2015-07-29 NOTE — Progress Notes (Addendum)
[facility] [address] [place] [address] [phone]  Date:  07/29/2015   Name:  Maria Bautista   DOB:  January 18, 1951   MRN:  [id number]  PCP:  No primary care provider on file.    Chief Complaint: Breast Problem; mole check; and medication question   History of Present Illness:  Maria Bautista is a 64 y.o. very pleasant female patient who presents with the following:  Here today with a few concerns  Last mammo in February of this year- stable, recommended dianostic in one year due to likely benign calcifcations Last week she was playing with her granddaughter and fell onto her side.  She notes some soreness in her breast since then that she wanted to be checked  She also notes a mole on her left arm- she is not sure how long it has been present. She does not think it has changed.  However it appear s a little different than her other moles so she was a little concerned.   No history of skin cancer  She also has noted some urinary leakage when she is walking especially.  She wears a liner only.  No incont with sneeze or cough- more that by the end of her walk she is dying to urinate and has to stop and pee frequently  She has noted it for about 6 months now.    We will check her labs today.    She was told that she had RA a few years back, and was treated by another doctor out of town.  However she was then re-checked by another provider and told that she did NOT have RA after all.  Her daughter recently had what sounds like a positive RF, and she wanted to be sure there is no need for concern   Patient Active Problem List   Diagnosis Date Noted  . Thinning hair 02/17/2015  . Borderline hyperglycemia 02/17/2015    Past Medical History  Diagnosis Date  . Allergy   . Arthritis     Past Surgical History  Procedure Laterality Date  . Appendectomy    . Cesarean section    . Fracture surgery    . Abdominal hysterectomy    . Spine surgery       Social History  Substance Use Topics  . Smoking status: Never Smoker   . Smokeless tobacco: None  . Alcohol Use: No    Family History  Problem Relation Age of Onset  . Heart disease Mother   . Hyperlipidemia Mother   . Diabetes Father     Allergies  Allergen Reactions  . Codeine     Medication list has been reviewed and updated.  Current Outpatient Prescriptions on File Prior to Visit  Medication Sig Dispense Refill  . Ascorbic Acid (VITAMIN C) 100 MG tablet Take 100 mg by mouth daily.    Marland Kitchen aspirin 81 MG tablet Take 81 mg by mouth daily.    . calcium carbonate (OS-CAL) 600 MG TABS tablet Take 600 mg by mouth 2 (two) times daily with a meal.    . cholecalciferol (VITAMIN D) 1000 UNITS tablet Take 1,000 Units by mouth daily.    Marland Kitchen lactobacillus acidophilus (BACID) TABS tablet Take 2 tablets by mouth 3 (three) times daily.    Marland Kitchen loratadine (CLARITIN) 10 MG tablet Take 10 mg by mouth daily.    Marland Kitchen omeprazole (PRILOSEC) 10 MG capsule Take 10 mg by mouth daily.    Marland Kitchen  spironolactone (ALDACTONE) 100 MG tablet Take 1 tablet (100 mg total) by mouth daily. 90 tablet 3  . cetirizine-pseudoephedrine (ZYRTEC-D) 5-120 MG per tablet Take 1 tablet by mouth 2 (two) times daily.    Marland Kitchen ipratropium (ATROVENT) 0.03 % nasal spray Place 2 sprays into the nose 4 (four) times daily. (Patient not taking: Reported on 07/29/2015) 30 mL 6   No current facility-administered medications on file prior to visit.    Review of Systems:  As per HPI- otherwise negative.   Physical Examination: Filed Vitals:   07/29/15 0949  BP: 122/64  Pulse: 86  Temp: 98.2 F (36.8 C)  Resp: 16   Filed Vitals:   07/29/15 0949  Height: 5' 2.5" (1.588 m)  Weight: 150 lb 9.6 oz (68.312 kg)   Body mass index is 27.09 kg/(m^2). Ideal Body Weight: Weight in (lb) to have BMI = 25: 138.6  GEN: WDWN, NAD, Non-toxic, A & O x 3, mild overweight, looks well HEENT: Atraumatic, Normocephalic. Neck supple. No masses, No LAD.   Bilateral TM wnl, oropharynx normal.  PEERL,EOMI.   Ears and Nose: No external deformity. CV: RRR, No M/G/R. No JVD. No thrill. No extra heart sounds. PULM: CTA B, no wheezes, crackles, rhonchi. No retractions. No resp. distress. No accessory muscle use. EXTR: No c/c/e NEURO Normal gait.  PSYCH: Normally interactive. Conversant. Not depressed or anxious appearing.  Calm demeanor.  Right breast: at the time of my exam she no longer noted any abnormality.  My exam is also normal with no masses or dimpling.   On the left upper outer arm there is a small brown flat mole with an asymmetrical border that is concerning her  VC obtained.  Area on left upper outer arm prepped with betadine and alcohol, anesthesia with 1& lidocaine.  Aseptic technique employed.  38mm punch, plced 1 HM suture with 5.0 prolene.  Dressed with band-aid bx to path  Results for orders placed or performed in visit on 07/29/15  POCT urinalysis dipstick  Result Value Ref Range   Color, UA yellow    Clarity, UA clear    Glucose, UA neg    Bilirubin, UA neg    Ketones, UA trace    Spec Grav, UA 1.020    Blood, UA small    pH, UA 5.0    Protein, UA neg    Urobilinogen, UA 0.2    Nitrite, UA neg    Leukocytes, UA moderate (2+) (A) Negative  POCT UA - Microscopic Only  Result Value Ref Range   WBC, Ur, HPF, POC 5-10    RBC, urine, microscopic 0-2    Bacteria, U Microscopic trace    Mucus, UA neg    Epithelial cells, urine per micros 0-1    Crystals, Ur, HPF, POC neg    Casts, Ur, LPF, POC neg    Yeast, UA neg     Assessment and Plan: Concern about female breast disease without diagnosis  Impaired glucose tolerance - Plan: Hemoglobin A1c  History of hyperkalemia - Plan: Comprehensive metabolic panel  Urinary urgency - Plan: POCT urinalysis dipstick, POCT UA - Microscopic Only, Urine culture, oxybutynin (DITROPAN) 5 MG tablet  Screening for deficiency anemia - Plan: CBC  Nevus of upper arm, left - Plan:  Dermatology pathology  Arthritis of hand - Plan: Ambulatory referral to Rheumatology  Will send out a urine culture to ensure no infection At this time her breast sx seem resolved. She will let me know of any further  concern Trial of ditropan (unless UTI) Await derm pathology Referral to rheum  Signed Lamar Blinks, MD  Received her urine culture on 9/8- it appears that she actually has a UTI.  Called in [place] for her.  Called and LMOM.  I will be in touch with the rest of her labs

## 2015-07-31 LAB — URINE CULTURE: Colony Count: 50000

## 2015-07-31 MED ORDER — NITROFURANTOIN MONOHYD MACRO 100 MG PO CAPS: 100.0000 mg | ORAL_CAPSULE | Freq: Two times a day (BID) | ORAL | Status: DC

## 2015-07-31 NOTE — Addendum Note (Signed)
Addended by: Lamar Blinks C on: 07/31/2015 03:29 PM   Modules accepted: Orders

## 2015-08-05 ENCOUNTER — Encounter: Payer: Self-pay | Admitting: Family Medicine

## 2015-08-08 ENCOUNTER — Ambulatory Visit (INDEPENDENT_AMBULATORY_CARE_PROVIDER_SITE_OTHER): Payer: Medicare Other | Admitting: Family Medicine

## 2015-08-08 VITALS — BP 120/74 | HR 73 | Temp 98.3°F | Resp 16 | Wt 148.6 lb

## 2015-08-08 DIAGNOSIS — R7303 Prediabetes: Secondary | ICD-10-CM

## 2015-08-08 DIAGNOSIS — R7309 Other abnormal glucose: Secondary | ICD-10-CM

## 2015-08-08 DIAGNOSIS — Z4802 Encounter for removal of sutures: Secondary | ICD-10-CM

## 2015-08-08 NOTE — Progress Notes (Signed)
[facility] [address], [place] [address] [phone]  Date:  08/08/2015   Name:  Maria Bautista   DOB:  03/15/1951   MRN:  [id number]  PCP:  No primary care provider on file.    Chief Complaint: suture removal   History of Present Illness:  Maria Bautista is a 64 y.o. very pleasant female patient who presents with the following:  Here today to remove suture from her left arm-I did a punch bx of a solar lentigo for her recently.  The area has healed well, no pain Also noted slightly increased serum calcium and borderline A1c She has increased her calcium supplementation a lot since she had thinning bones on recent scan- she will cut down again She is also just taking her ditropan once a day now as it was giving her dry mouth  Lab Results  Component Value Date   HGBA1C 5.7* 07/29/2015   She will come in for repeat labs in 6 months.    Patient Active Problem List   Diagnosis Date Noted  . Thinning hair 02/17/2015  . Borderline hyperglycemia 02/17/2015    Past Medical History  Diagnosis Date  . Allergy   . Arthritis     Past Surgical History  Procedure Laterality Date  . Appendectomy    . Cesarean section    . Fracture surgery    . Abdominal hysterectomy    . Spine surgery      Social History  Substance Use Topics  . Smoking status: Never Smoker   . Smokeless tobacco: None  . Alcohol Use: No    Family History  Problem Relation Age of Onset  . Heart disease Mother   . Hyperlipidemia Mother   . Diabetes Father     Allergies  Allergen Reactions  . Codeine     Medication list has been reviewed and updated.  Current Outpatient Prescriptions on File Prior to Visit  Medication Sig Dispense Refill  . Ascorbic Acid (VITAMIN C) 100 MG tablet Take 100 mg by mouth daily.    Marland Kitchen aspirin 81 MG tablet Take 81 mg by mouth daily.    . calcium carbonate (OS-CAL) 600 MG TABS tablet Take 600 mg by mouth 2 (two) times daily with a meal.    .  cetirizine-pseudoephedrine (ZYRTEC-D) 5-120 MG per tablet Take 1 tablet by mouth 2 (two) times daily.    . cholecalciferol (VITAMIN D) 1000 UNITS tablet Take 1,000 Units by mouth daily.    Marland Kitchen oxybutynin (DITROPAN) 5 MG tablet Take 1 tablet (5 mg total) by mouth 2 (two) times daily. 60 tablet 3  . spironolactone (ALDACTONE) 100 MG tablet Take 1 tablet (100 mg total) by mouth daily. 90 tablet 3   No current facility-administered medications on file prior to visit.    Review of Systems:  As per HPI- otherwise negative.   Physical Examination: Filed Vitals:   08/08/15 0933  BP: 120/74  Pulse: 73  Temp: 98.3 F (36.8 C)  Resp: 16   Filed Vitals:   08/08/15 0933  Weight: 148 lb 9.6 oz (67.405 kg)   Body mass index is 26.73 kg/(m^2). Ideal Body Weight:     GEN: WDWN, NAD, Non-toxic, Alert & Oriented x 3 HEENT: Atraumatic, Normocephalic.  Ears and Nose: No external deformity. EXTR: No clubbing/cyanosis/edema NEURO: Normal gait.  PSYCH: Normally interactive. Conversant. Not depressed or anxious appearing.  Calm demeanor.  Removed #1 HM suture from her left arm, well healed  Assessment and Plan: Visit for suture removal  Pre-diabetes - Plan: Hemoglobin A1c  Serum calcium elevated - Plan: Basic metabolic panel   Removed suture, placed lab order for 6 months   Signed Lamar Blinks, MD

## 2015-08-21 DIAGNOSIS — M19042 Primary osteoarthritis, left hand: Secondary | ICD-10-CM | POA: Diagnosis not present

## 2015-08-21 DIAGNOSIS — M19041 Primary osteoarthritis, right hand: Secondary | ICD-10-CM | POA: Diagnosis not present

## 2015-08-21 DIAGNOSIS — M79641 Pain in right hand: Secondary | ICD-10-CM | POA: Diagnosis not present

## 2015-08-21 DIAGNOSIS — M545 Low back pain: Secondary | ICD-10-CM | POA: Diagnosis not present

## 2015-08-21 DIAGNOSIS — M79642 Pain in left hand: Secondary | ICD-10-CM | POA: Diagnosis not present

## 2015-08-28 ENCOUNTER — Encounter: Payer: Self-pay | Admitting: Family Medicine

## 2015-08-28 DIAGNOSIS — M1990 Unspecified osteoarthritis, unspecified site: Secondary | ICD-10-CM | POA: Insufficient documentation

## 2015-11-20 ENCOUNTER — Other Ambulatory Visit: Payer: Self-pay | Admitting: Family Medicine

## 2015-11-20 DIAGNOSIS — R921 Mammographic calcification found on diagnostic imaging of breast: Secondary | ICD-10-CM

## 2016-01-08 ENCOUNTER — Ambulatory Visit
Admission: RE | Admit: 2016-01-08 | Discharge: 2016-01-08 | Disposition: A | Discharge status: Home / Self Care | Payer: Medicare Other | Source: Ambulatory Visit | Attending: Family Medicine | Admitting: Family Medicine

## 2016-01-08 ENCOUNTER — Other Ambulatory Visit: Payer: Self-pay | Admitting: Family Medicine

## 2016-01-08 DIAGNOSIS — R921 Mammographic calcification found on diagnostic imaging of breast: Secondary | ICD-10-CM

## 2016-01-10 ENCOUNTER — Ambulatory Visit (INDEPENDENT_AMBULATORY_CARE_PROVIDER_SITE_OTHER): Payer: Medicare Other | Admitting: Physician Assistant

## 2016-01-10 VITALS — BP 122/80 | HR 78 | Temp 98.1°F | Resp 17 | Ht 63.0 in | Wt 148.0 lb

## 2016-01-10 DIAGNOSIS — Z1159 Encounter for screening for other viral diseases: Secondary | ICD-10-CM | POA: Diagnosis not present

## 2016-01-10 DIAGNOSIS — M62838 Other muscle spasm: Secondary | ICD-10-CM

## 2016-01-10 DIAGNOSIS — M6248 Contracture of muscle, other site: Secondary | ICD-10-CM | POA: Diagnosis not present

## 2016-01-10 DIAGNOSIS — J019 Acute sinusitis, unspecified: Secondary | ICD-10-CM

## 2016-01-10 LAB — CBC
HCT: 39.5 % (ref 36.0–46.0)
Hemoglobin: 13.1 g/dL (ref 12.0–15.0)
MCH: 29 pg (ref 26.0–34.0)
MCHC: 33.2 g/dL (ref 30.0–36.0)
MCV: 87.4 fL (ref 78.0–100.0)
MPV: 9 fL (ref 8.6–12.4)
Platelets: 412 10*3/uL — ABNORMAL HIGH (ref 150–400)
RBC: 4.52 MIL/uL (ref 3.87–5.11)
RDW: 12.7 % (ref 11.5–15.5)
WBC: 6.9 10*3/uL (ref 4.0–10.5)

## 2016-01-10 LAB — HEPATITIS C ANTIBODY: HCV Ab: NEGATIVE

## 2016-01-10 MED ORDER — AMOXICILLIN 875 MG PO TABS: 875.0000 mg | ORAL_TABLET | Freq: Two times a day (BID) | ORAL | Status: DC

## 2016-01-10 MED ORDER — CYCLOBENZAPRINE HCL 5 MG PO TABS: 5.0000 mg | ORAL_TABLET | Freq: Three times a day (TID) | ORAL | Status: DC | PRN

## 2016-01-10 MED ORDER — CETIRIZINE HCL 10 MG PO TABS: 10.0000 mg | ORAL_TABLET | Freq: Every day | ORAL | Status: DC

## 2016-01-10 NOTE — Patient Instructions (Addendum)
Please hydrate well with 64 oz of water per day.   I would like you to continue the mucinex. Please continue the flonase as well.   Please perform these range of motion exercises for your neck.   Please let me know if you have no improvement of your neck pain in 10 days.    Sinusitis, Adult Sinusitis is redness, soreness, and inflammation of the paranasal sinuses. Paranasal sinuses are air pockets within the bones of your face. They are located beneath your eyes, in the middle of your forehead, and above your eyes. In healthy paranasal sinuses, mucus is able to drain out, and air is able to circulate through them by way of your nose. However, when your paranasal sinuses are inflamed, mucus and air can become trapped. This can allow bacteria and other germs to grow and cause infection. Sinusitis can develop quickly and last only a short time (acute) or continue over a long period (chronic). Sinusitis that lasts for more than 12 weeks is considered chronic. CAUSES Causes of sinusitis include:  Allergies.  Structural abnormalities, such as displacement of the cartilage that separates your nostrils (deviated septum), which can decrease the air flow through your nose and sinuses and affect sinus drainage.  Functional abnormalities, such as when the small hairs (cilia) that line your sinuses and help remove mucus do not work properly or are not present. SIGNS AND SYMPTOMS Symptoms of acute and chronic sinusitis are the same. The primary symptoms are pain and pressure around the affected sinuses. Other symptoms include:  Upper toothache.  Earache.  Headache.  Bad breath.  Decreased sense of smell and taste.  A cough, which worsens when you are lying flat.  Fatigue.  Fever.  Thick drainage from your nose, which often is green and may contain pus (purulent).  Swelling and warmth over the affected sinuses. DIAGNOSIS Your health care provider will perform a physical exam. During your  exam, your health care provider may perform any of the following to help determine if you have acute sinusitis or chronic sinusitis:  Look in your nose for signs of abnormal growths in your nostrils (nasal polyps).  Tap over the affected sinus to check for signs of infection.  View the inside of your sinuses using an imaging device that has a light attached (endoscope). If your health care provider suspects that you have chronic sinusitis, one or more of the following tests may be recommended:  Allergy tests.  Nasal culture. A sample of mucus is taken from your nose, sent to a lab, and screened for bacteria.  Nasal cytology. A sample of mucus is taken from your nose and examined by your health care provider to determine if your sinusitis is related to an allergy. TREATMENT Most cases of acute sinusitis are related to a viral infection and will resolve on their own within 10 days. Sometimes, medicines are prescribed to help relieve symptoms of both acute and chronic sinusitis. These may include pain medicines, decongestants, nasal steroid sprays, or saline sprays. However, for sinusitis related to a bacterial infection, your health care provider will prescribe antibiotic medicines. These are medicines that will help kill the bacteria causing the infection. Rarely, sinusitis is caused by a fungal infection. In these cases, your health care provider will prescribe antifungal medicine. For some cases of chronic sinusitis, surgery is needed. Generally, these are cases in which sinusitis recurs more than 3 times per year, despite other treatments. HOME CARE INSTRUCTIONS  Drink plenty of water. Water helps  thin the mucus so your sinuses can drain more easily.  Use a humidifier.  Inhale steam 3-4 times a day (for example, sit in the bathroom with the shower running).  Apply a warm, moist washcloth to your face 3-4 times a day, or as directed by your health care provider.  Use saline nasal sprays  to help moisten and clean your sinuses.  Take medicines only as directed by your health care provider.  If you were prescribed either an antibiotic or antifungal medicine, finish it all even if you start to feel better. SEEK IMMEDIATE MEDICAL CARE IF:  You have increasing pain or severe headaches.  You have nausea, vomiting, or drowsiness.  You have swelling around your face.  You have vision problems.  You have a stiff neck.  You have difficulty breathing.   This information is not intended to replace advice given to you by your health care provider. Make sure you discuss any questions you have with your health care provider.   Document Released: 11/08/2005 Document Revised: 11/29/2014 Document Reviewed: 11/23/2011 Elsevier Interactive Patient Education 2016 [employer] Cervical Strain and Sprain With Rehab Cervical strain and sprain are injuries that commonly occur with "whiplash" injuries. Whiplash occurs when the neck is forcefully whipped backward or forward, such as during a motor vehicle accident or during contact sports. The muscles, ligaments, tendons, discs, and nerves of the neck are susceptible to injury when this occurs. RISK FACTORS Risk of having a whiplash injury increases if:  Osteoarthritis of the spine.  Situations that make head or neck accidents or trauma more likely.  High-risk sports (football, rugby, wrestling, hockey, auto racing, gymnastics, diving, contact karate, or boxing).  Poor strength and flexibility of the neck.  Previous neck injury.  Poor tackling technique.  Improperly fitted or padded equipment. SYMPTOMS   Pain or stiffness in the front or back of neck or both.  Symptoms may present immediately or up to 24 hours after injury.  Dizziness, headache, nausea, and vomiting.  Muscle spasm with soreness and stiffness in the neck.  Tenderness and swelling at the injury site. PREVENTION  Learn and use proper technique (avoid  tackling with the head, spearing, and head-butting; use proper falling techniques to avoid landing on the head).  Warm up and stretch properly before activity.  Maintain physical fitness:  Strength, flexibility, and endurance.  Cardiovascular fitness.  Wear properly fitted and padded protective equipment, such as padded soft collars, for participation in contact sports. PROGNOSIS  Recovery from cervical strain and sprain injuries is dependent on the extent of the injury. These injuries are usually curable in 1 week to 3 months with appropriate treatment.  RELATED COMPLICATIONS   Temporary numbness and weakness may occur if the nerve roots are damaged, and this may persist until the nerve has completely healed.  Chronic pain due to frequent recurrence of symptoms.  Prolonged healing, especially if activity is resumed too soon (before complete recovery). TREATMENT  Treatment initially involves the use of ice and medication to help reduce pain and inflammation. It is also important to perform strengthening and stretching exercises and modify activities that worsen symptoms so the injury does not get worse. These exercises may be performed at home or with a therapist. For patients who experience severe symptoms, a soft, padded collar may be recommended to be worn around the neck.  Improving your posture may help reduce symptoms. Posture improvement includes pulling your chin and abdomen in while sitting or standing. If you are sitting,  sit in a firm chair with your buttocks against the back of the chair. While sleeping, try replacing your pillow with a small towel rolled to 2 inches in diameter, or use a cervical pillow or soft cervical collar. Poor sleeping positions delay healing.  For patients with nerve root damage, which causes numbness or weakness, the use of a cervical traction apparatus may be recommended. Surgery is rarely necessary for these injuries. However, cervical strain and sprains  that are present at birth (congenital) may require surgery. MEDICATION   If pain medication is necessary, nonsteroidal anti-inflammatory medications, such as aspirin and ibuprofen, or other minor pain relievers, such as acetaminophen, are often recommended.  Do not take pain medication for 7 days before surgery.  Prescription pain relievers may be given if deemed necessary by your caregiver. Use only as directed and only as much as you need. HEAT AND COLD:   Cold treatment (icing) relieves pain and reduces inflammation. Cold treatment should be applied for 10 to 15 minutes every 2 to 3 hours for inflammation and pain and immediately after any activity that aggravates your symptoms. Use ice packs or an ice massage.  Heat treatment may be used prior to performing the stretching and strengthening activities prescribed by your caregiver, physical therapist, or athletic trainer. Use a heat pack or a warm soak. SEEK MEDICAL CARE IF:   Symptoms get worse or do not improve in 2 weeks despite treatment.  New, unexplained symptoms develop (drugs used in treatment may produce side effects). EXERCISES RANGE OF MOTION (ROM) AND STRETCHING EXERCISES - Cervical Strain and Sprain These exercises may help you when beginning to rehabilitate your injury. In order to successfully resolve your symptoms, you must improve your posture. These exercises are designed to help reduce the forward-head and rounded-shoulder posture which contributes to this condition. Your symptoms may resolve with or without further involvement from your physician, physical therapist or athletic trainer. While completing these exercises, remember:   Restoring tissue flexibility helps normal motion to return to the joints. This allows healthier, less painful movement and activity.  An effective stretch should be held for at least 20 seconds, although you may need to begin with shorter hold times for comfort.  A stretch should never be  painful. You should only feel a gentle lengthening or release in the stretched tissue. STRETCH- Axial Extensors  Lie on your back on the floor. You may bend your knees for comfort. Place a rolled-up hand towel or dish towel, about 2 inches in diameter, under the part of your head that makes contact with the floor.  Gently tuck your chin, as if trying to make a "double chin," until you feel a gentle stretch at the base of your head.  Hold __________ seconds. Repeat __________ times. Complete this exercise __________ times per day.  STRETCH - Axial Extension   Stand or sit on a firm surface. Assume a good posture: chest up, shoulders drawn back, abdominal muscles slightly tense, knees unlocked (if standing) and feet hip width apart.  Slowly retract your chin so your head slides back and your chin slightly lowers. Continue to look straight ahead.  You should feel a gentle stretch in the back of your head. Be certain not to feel an aggressive stretch since this can cause headaches later.  Hold for __________ seconds. Repeat __________ times. Complete this exercise __________ times per day. STRETCH - Cervical Side Bend   Stand or sit on a firm surface. Assume a good posture: chest  up, shoulders drawn back, abdominal muscles slightly tense, knees unlocked (if standing) and feet hip width apart.  Without letting your nose or shoulders move, slowly tip your right / left ear to your shoulder until your feel a gentle stretch in the muscles on the opposite side of your neck.  Hold __________ seconds. Repeat __________ times. Complete this exercise __________ times per day. STRETCH - Cervical Rotators   Stand or sit on a firm surface. Assume a good posture: chest up, shoulders drawn back, abdominal muscles slightly tense, knees unlocked (if standing) and feet hip width apart.  Keeping your eyes level with the ground, slowly turn your head until you feel a gentle stretch along the back and opposite  side of your neck.  Hold __________ seconds. Repeat __________ times. Complete this exercise __________ times per day. RANGE OF MOTION - Neck Circles   Stand or sit on a firm surface. Assume a good posture: chest up, shoulders drawn back, abdominal muscles slightly tense, knees unlocked (if standing) and feet hip width apart.  Gently roll your head down and around from the back of one shoulder to the back of the other. The motion should never be forced or painful.  Repeat the motion 10-20 times, or until you feel the neck muscles relax and loosen. Repeat __________ times. Complete the exercise __________ times per day. STRENGTHENING EXERCISES - Cervical Strain and Sprain These exercises may help you when beginning to rehabilitate your injury. They may resolve your symptoms with or without further involvement from your physician, physical therapist, or athletic trainer. While completing these exercises, remember:   Muscles can gain both the endurance and the strength needed for everyday activities through controlled exercises.  Complete these exercises as instructed by your physician, physical therapist, or athletic trainer. Progress the resistance and repetitions only as guided.  You may experience muscle soreness or fatigue, but the pain or discomfort you are trying to eliminate should never worsen during these exercises. If this pain does worsen, stop and make certain you are following the directions exactly. If the pain is still present after adjustments, discontinue the exercise until you can discuss the trouble with your clinician. STRENGTH - Cervical Flexors, Isometric  Face a wall, standing about 6 inches away. Place a small pillow, a ball about 6-8 inches in diameter, or a folded towel between your forehead and the wall.  Slightly tuck your chin and gently push your forehead into the soft object. Push only with mild to moderate intensity, building up tension gradually. Keep your jaw  and forehead relaxed.  Hold 10 to 20 seconds. Keep your breathing relaxed.  Release the tension slowly. Relax your neck muscles completely before you start the next repetition. Repeat __________ times. Complete this exercise __________ times per day. STRENGTH- Cervical Lateral Flexors, Isometric   Stand about 6 inches away from a wall. Place a small pillow, a ball about 6-8 inches in diameter, or a folded towel between the side of your head and the wall.  Slightly tuck your chin and gently tilt your head into the soft object. Push only with mild to moderate intensity, building up tension gradually. Keep your jaw and forehead relaxed.  Hold 10 to 20 seconds. Keep your breathing relaxed.  Release the tension slowly. Relax your neck muscles completely before you start the next repetition. Repeat __________ times. Complete this exercise __________ times per day. STRENGTH - Cervical Extensors, Isometric   Stand about 6 inches away from a wall. Place a small  pillow, a ball about 6-8 inches in diameter, or a folded towel between the back of your head and the wall.  Slightly tuck your chin and gently tilt your head back into the soft object. Push only with mild to moderate intensity, building up tension gradually. Keep your jaw and forehead relaxed.  Hold 10 to 20 seconds. Keep your breathing relaxed.  Release the tension slowly. Relax your neck muscles completely before you start the next repetition. Repeat __________ times. Complete this exercise __________ times per day. POSTURE AND BODY MECHANICS CONSIDERATIONS - Cervical Strain and Sprain Keeping correct posture when sitting, standing or completing your activities will reduce the stress put on different body tissues, allowing injured tissues a chance to heal and limiting painful experiences. The following are general guidelines for improved posture. Your physician or physical therapist will provide you with any instructions specific to your  needs. While reading these guidelines, remember:  The exercises prescribed by your provider will help you have the flexibility and strength to maintain correct postures.  The correct posture provides the optimal environment for your joints to work. All of your joints have less wear and tear when properly supported by a spine with good posture. This means you will experience a healthier, less painful body.  Correct posture must be practiced with all of your activities, especially prolonged sitting and standing. Correct posture is as important when doing repetitive low-stress activities (typing) as it is when doing a single heavy-load activity (lifting). PROLONGED STANDING WHILE SLIGHTLY LEANING FORWARD When completing a task that requires you to lean forward while standing in one place for a long time, place either foot up on a stationary 2- to 4-inch high object to help maintain the best posture. When both feet are on the ground, the low back tends to lose its slight inward curve. If this curve flattens (or becomes too large), then the back and your other joints will experience too much stress, fatigue more quickly, and can cause pain.  RESTING POSITIONS Consider which positions are most painful for you when choosing a resting position. If you have pain with flexion-based activities (sitting, bending, stooping, squatting), choose a position that allows you to rest in a less flexed posture. You would want to avoid curling into a fetal position on your side. If your pain worsens with extension-based activities (prolonged standing, working overhead), avoid resting in an extended position such as sleeping on your stomach. Most people will find more comfort when they rest with their spine in a more neutral position, neither too rounded nor too arched. Lying on a non-sagging bed on your side with a pillow between your knees, or on your back with a pillow under your knees will often provide some relief. Keep in  mind, being in any one position for a prolonged period of time, no matter how correct your posture, can still lead to stiffness. WALKING Walk with an upright posture. Your ears, shoulders, and hips should all line up. OFFICE WORK When working at a desk, create an environment that supports good, upright posture. Without extra support, muscles fatigue and lead to excessive strain on joints and other tissues. CHAIR:  A chair should be able to slide under your desk when your back makes contact with the back of the chair. This allows you to work closely.  The chair's height should allow your eyes to be level with the upper part of your monitor and your hands to be slightly lower than your elbows.  Body  position:  Your feet should make contact with the floor. If this is not possible, use a foot rest.  Keep your ears over your shoulders. This will reduce stress on your neck and low back.   This information is not intended to replace advice given to you by your health care provider. Make sure you discuss any questions you have with your health care provider.   Document Released: 11/08/2005 Document Revised: 11/29/2014 Document Reviewed: 02/20/2009 Elsevier Interactive Patient Education [employer].

## 2016-01-10 NOTE — Progress Notes (Signed)
[facility] [address] [place] [address] [phone]  Date:  01/10/2016   Name:  Maria Bautista   DOB:  [birth year]   MRN:  [id number]  PCP:  No primary care provider on file.    History of Present Illness:  Maria Bautista is a 65 y.o. female patient who presents to [facility] for cc of sinus congestion.    She has had 2 weeks of congestion.  She has noticed that this is started to turn green.  She has developed cough.  No ear pain or throat pain. She has allergies, and is taking claritin at this time.  Switches it monthly from the claritin, zyrtec.  She has no sinus pain, nasal bleeding, or fever.   Mucinex has not been beneficial at this time. She has taken flonase which helped.  She takes alkaseltzer at night which helped, but the symptoms return.  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   She also complains of neck pain for 1 month.  It is intermittent at tthe left side of her neck.  Sometimes if she looks towards her right she will then feel it.  It began, after she slept oddly 1 month ago.  She has attempted to stretch it which helps but never fully recovers.     Patient Active Problem List   Diagnosis Date Noted  . Osteoarthritis 08/28/2015  . Thinning hair 02/17/2015  . Borderline hyperglycemia 02/17/2015    Past Medical History  Diagnosis Date  . Allergy   . Arthritis     Past Surgical History  Procedure Laterality Date  . Appendectomy    . Cesarean section    . Fracture surgery    . Abdominal hysterectomy    . Spine surgery      Social History  Substance Use Topics  . Smoking status: Never Smoker   . Smokeless tobacco: None  . Alcohol Use: No    Family History  Problem Relation Age of Onset  . Heart disease Mother   . Hyperlipidemia Mother   . Diabetes Father     Allergies  Allergen Reactions  . Codeine     Medication list has been reviewed and updated.  Current Outpatient Prescriptions on File Prior to Visit  Medication Sig  Dispense Refill  . Ascorbic Acid (VITAMIN C) 100 MG tablet Take 100 mg by mouth daily.    Marland Kitchen aspirin 81 MG tablet Take 81 mg by mouth daily.    . calcium carbonate (OS-CAL) 600 MG TABS tablet Take 600 mg by mouth 2 (two) times daily with a meal.    . cetirizine-pseudoephedrine (ZYRTEC-D) 5-120 MG per tablet Take 1 tablet by mouth 2 (two) times daily.    . cholecalciferol (VITAMIN D) 1000 UNITS tablet Take 1,000 Units by mouth daily.    Marland Kitchen omeprazole (PRILOSEC) 10 MG capsule Take 10 mg by mouth daily.    Marland Kitchen spironolactone (ALDACTONE) 100 MG tablet Take 1 tablet (100 mg total) by mouth daily. 90 tablet 3   No current facility-administered medications on file prior to visit.    ROS ROS otherwise unremarkable unless listed above.   Physical Examination: BP 122/80 mmHg  Pulse 78  Temp(Src) 98.1 F (36.7 C) (Oral)  Resp 17  Ht 5\' 3"  (1.6 m)  Wt 148 lb (67.132 kg)  BMI 26.22 kg/m2  SpO2 96% Ideal Body Weight: Weight in (lb) to have BMI = 25: 140.8  Physical Exam  Constitutional: She is oriented to person, place,  and time. She appears well-developed and well-nourished. No distress.  HENT:  Head: Normocephalic and atraumatic.  Right Ear: Tympanic membrane, external ear and ear canal normal.  Left Ear: Tympanic membrane, external ear and ear canal normal.  Nose: Mucosal edema and rhinorrhea present. Right sinus exhibits no maxillary sinus tenderness and no frontal sinus tenderness. Left sinus exhibits no maxillary sinus tenderness and no frontal sinus tenderness.  Mouth/Throat: No uvula swelling. No oropharyngeal exudate, posterior oropharyngeal edema or posterior oropharyngeal erythema.  Eyes: Conjunctivae and EOM are normal. Pupils are equal, round, and reactive to light.  Cardiovascular: Normal rate and regular rhythm.  Exam reveals no gallop, no distant heart sounds and no friction rub.   No murmur heard. Pulses:      Radial pulses are 2+ on the right side, and 2+ on the left side.   Pulmonary/Chest: Effort normal. No apnea. No respiratory distress. She has no decreased breath sounds. She has no wheezes. She has no rhonchi.  Musculoskeletal: She exhibits no edema or tenderness.  Lymphadenopathy:       Head (right side): No submandibular, no tonsillar, no preauricular and no posterior auricular adenopathy present.       Head (left side): No submandibular, no tonsillar, no preauricular and no posterior auricular adenopathy present.  Neurological: She is alert and oriented to person, place, and time.  Skin: She is not diaphoretic.  Psychiatric: She has a normal mood and affect. Her behavior is normal.     Assessment and Plan: Maria Bautista is a 65 y.o. female who is here today for sinus congestion Will treat for sinus inflammation of bacterial etiology.  Advised to continue the flonase, and switch to zyrtec at this time.   Patient inquires of a hep c test.  This is appropriate for her age as one time screening, and we can perform this today.  She will get a prevnar at her pharmacy.  It appears to be that there was a mix up in the order of her pneumococcal 13 vs 23.  This is scheduled in May.  She appears to be uptodate in her vaccinations.   Subacute sinusitis, unspecified location - Plan: amoxicillin (AMOXIL) 875 MG tablet, CBC, cetirizine (ZYRTEC) 10 MG tablet  Need for hepatitis C screening test - Plan: Hepatitis C antibody  Ivar Drape, PA-C [facility] 2/18/20175:52 PM

## 2016-01-29 ENCOUNTER — Ambulatory Visit (INDEPENDENT_AMBULATORY_CARE_PROVIDER_SITE_OTHER): Payer: Medicare Other | Admitting: Family Medicine

## 2016-01-29 VITALS — BP 122/76 | HR 81 | Temp 97.6°F | Resp 20 | Ht 63.0 in | Wt 148.2 lb

## 2016-01-29 DIAGNOSIS — J0100 Acute maxillary sinusitis, unspecified: Secondary | ICD-10-CM

## 2016-01-29 MED ORDER — LEVOFLOXACIN 500 MG PO TABS: 500.0000 mg | ORAL_TABLET | Freq: Every day | ORAL | Status: DC

## 2016-01-29 MED ORDER — PREDNISONE 20 MG PO TABS: ORAL_TABLET | ORAL | Status: DC

## 2016-01-29 NOTE — Progress Notes (Signed)
Subjective:  This chart was scribed for Robyn Haber MD, by Tamsen Roers, at [facility].  This patient was seen in room 9 and the patient's care was started at 9:57 AM.   Chief Complaint  Patient presents with  . Follow-up    sinusitis      Patient ID: Maria Bautista, female    DOB: 04-10-1951, 65 y.o.   MRN: [id number]  HPI HPI Comments: Maria Bautista is a 65 y.o. female who presents to the [facility] complaining of sinus issues.  Patient received an antibiotic and cough medication here three weeks ago and continued on her Mucin-Ex.  She states that her symptoms have not changed as she still feels congested and "full" with a headache and constant rhinorrhea.  Patient has seasonal allergies which usually last for a week.  She is taking the Zyrtec daily. She has never used nasal steroids and has Flonase at home (which she uses daily every morning).  She is new to [place] (from [place]).  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    Patient Active Problem List   Diagnosis Date Noted  . Osteoarthritis 08/28/2015  . Thinning hair 02/17/2015  . Borderline hyperglycemia 02/17/2015   Past Medical History  Diagnosis Date  . Allergy   . Arthritis    Past Surgical History  Procedure Laterality Date  . Appendectomy    . Cesarean section    . Fracture surgery    . Abdominal hysterectomy    . Spine surgery     Allergies  Allergen Reactions  . Codeine    Prior to Admission medications   Medication Sig Start Date End Date Taking? Authorizing Provider  amoxicillin (AMOXIL) 875 MG tablet Take 1 tablet (875 mg total) by mouth 2 (two) times daily. 01/10/16  Yes Stephanie D English, PA  Ascorbic Acid (VITAMIN C) 100 MG tablet Take 100 mg by mouth daily.   Yes Historical Provider, MD  aspirin 81 MG tablet Take 81 mg by mouth daily.   Yes Historical Provider, MD  calcium carbonate (OS-CAL) 600 MG TABS tablet Take  600 mg by mouth 2 (two) times daily with a meal.   Yes Historical Provider, MD  cetirizine (ZYRTEC) 10 MG tablet Take 1 tablet (10 mg total) by mouth daily. 01/10/16  Yes Stephanie D English, PA  cetirizine-pseudoephedrine (ZYRTEC-D) 5-120 MG per tablet Take 1 tablet by mouth 2 (two) times daily.   Yes Historical Provider, MD  cholecalciferol (VITAMIN D) 1000 UNITS tablet Take 1,000 Units by mouth daily.   Yes Historical Provider, MD  cyclobenzaprine (FLEXERIL) 5 MG tablet Take 1-2 tablets (5-10 mg total) by mouth 3 (three) times daily as needed for muscle spasms. 01/10/16  Yes Dorian Heckle English, PA  omeprazole (PRILOSEC) 10 MG capsule Take 10 mg by mouth daily.   Yes Historical Provider, MD  spironolactone (ALDACTONE) 100 MG tablet Take 1 tablet (100 mg total) by mouth daily. 02/17/15  Yes Darreld Mclean, MD   Social History   Social History  . Marital Status: Married    Spouse Name: N/A  . Number of Children: N/A  . Years of Education: N/A   Occupational History  . Not on file.   Social History Main Topics  . Smoking status: Never Smoker   . Smokeless tobacco: Not on file  . Alcohol Use: No  . Drug Use: No  . Sexual Activity: Yes  Birth Control/ Protection: Surgical   Other Topics Concern  . Not on file   Social History Narrative       Review of Systems  HENT: Positive for congestion, postnasal drip, rhinorrhea and sinus pressure. Negative for sore throat.   Respiratory: Negative for choking and shortness of breath.   Gastrointestinal: Negative for nausea and vomiting.  Musculoskeletal: Negative for neck pain and neck stiffness.  Neurological: Positive for headaches. Negative for seizures, syncope and speech difficulty.       Objective:   Physical Exam  Constitutional: She is oriented to person, place, and time. She appears well-developed and well-nourished. No distress.  HENT:  Head: Normocephalic and atraumatic.  Eyes: Pupils are equal, round, and reactive to  light.  Cardiovascular: Normal rate.   Pulmonary/Chest: Effort normal. No respiratory distress.  Musculoskeletal: Normal range of motion.  Neurological: She is alert and oriented to person, place, and time.  Skin: Skin is warm and dry.  Psychiatric: She has a normal mood and affect. Her behavior is normal.  Nursing note and vitals reviewed.   Filed Vitals:   01/29/16 0951  BP: 122/76  Pulse: 81  Temp: 97.6 F (36.4 C)  TempSrc: Oral  Resp: 20  Height: 5\' 3"  (1.6 m)  Weight: 148 lb 3.2 oz (67.223 kg)  SpO2: 97%         Assessment & Plan:   This chart was scribed in my presence and reviewed by me personally.    ICD-9-CM ICD-10-CM   1. Acute maxillary sinusitis, recurrence not specified 461.0 J01.00 predniSONE (DELTASONE) 20 MG tablet     levofloxacin (LEVAQUIN) 500 MG tablet     Signed, Robyn Haber, MD

## 2016-01-29 NOTE — Patient Instructions (Signed)

## 2016-03-07 ENCOUNTER — Other Ambulatory Visit: Payer: Self-pay | Admitting: Family Medicine

## 2016-03-08 NOTE — Telephone Encounter (Signed)
Called and LMOM- I did refill her spiro but she needs to be seen in the next month or so for a K level.  Please see me or the provider of her choice

## 2016-04-16 ENCOUNTER — Other Ambulatory Visit (INDEPENDENT_AMBULATORY_CARE_PROVIDER_SITE_OTHER): Payer: Medicare Other | Admitting: *Deleted

## 2016-04-16 DIAGNOSIS — R7303 Prediabetes: Secondary | ICD-10-CM | POA: Diagnosis not present

## 2016-04-16 LAB — BASIC METABOLIC PANEL
BUN: 14 mg/dL (ref 7–25)
CO2: 26 mmol/L (ref 20–31)
Calcium: 10.4 mg/dL (ref 8.6–10.4)
Chloride: 104 mmol/L (ref 98–110)
Creat: 0.71 mg/dL (ref 0.50–0.99)
Glucose, Bld: 95 mg/dL (ref 65–99)
Potassium: 5.3 mmol/L (ref 3.5–5.3)
Sodium: 139 mmol/L (ref 135–146)

## 2016-04-16 LAB — HEMOGLOBIN A1C
Hgb A1c MFr Bld: 5.6 % (ref <5.7)
Mean Plasma Glucose: 114 mg/dL

## 2016-04-17 ENCOUNTER — Encounter: Payer: Self-pay | Admitting: Family Medicine

## 2016-06-06 ENCOUNTER — Other Ambulatory Visit: Payer: Self-pay | Admitting: Physician Assistant

## 2016-06-08 ENCOUNTER — Other Ambulatory Visit: Payer: Self-pay

## 2016-06-08 NOTE — Telephone Encounter (Signed)
fax request for Cetirizine 10mg . Pt has seen Copland at [facility], Canceled appt with Brigitte Pulse 06/2016.  Returned fax to contact Copland @[email or web address] 

## 2016-06-11 ENCOUNTER — Other Ambulatory Visit: Payer: Self-pay | Admitting: Family Medicine

## 2016-06-28 ENCOUNTER — Ambulatory Visit (INDEPENDENT_AMBULATORY_CARE_PROVIDER_SITE_OTHER): Payer: Medicare Other | Admitting: Physician Assistant

## 2016-06-28 VITALS — BP 115/74 | HR 86 | Temp 97.8°F | Resp 16 | Ht 62.5 in | Wt 149.4 lb

## 2016-06-28 DIAGNOSIS — M26621 Arthralgia of right temporomandibular joint: Secondary | ICD-10-CM | POA: Diagnosis not present

## 2016-06-28 NOTE — Progress Notes (Signed)
Patient ID: Maria Bautista, female   DOB: August 23, 1951, 65 y.o.   MRN: [id number] [facility] [address], [place] [address] [phone]  By signing my name below I, Tereasa Coop, attest that this documentation has been prepared under the direction and in the presence of Ivar Drape PA. Electonically Signed. Tereasa Coop, Scribe 06/28/2016 at 9:28 AM   Date:  06/28/2016   Name:  Maria Bautista   DOB:  03-24-1951   MRN:  [id number]  PCP:  Delman Cheadle, MD    History of Present Illness:  Maria Bautista is a 65 y.o. female patient who presents to [facility] c/o rt ear "throbbing" pain coming and going and has worsed in the past week. Note that pt points to rt TMJ when asked where she is in pain and indicates that the pain radiates into and around her rt ear. Pain is worsened by palpation and by yawning. Pt has 2-3 episodes of pain per day. Pt also c/o tingling in her rt ear. Pt denies any rash, ear drainage, dizziness, facial numbness, changes in her hearing. Pt denies any mouth pain or fever. Pt has been taking tylenol for pain with minimal relief. Pt is unaware of any history of clenching her teeth.  Pt reports having an abscessed rt upper tooth in 2012 and had to see an oral surgeon because the decay had radiated into her bone. Pt states that some of her upper jaw bone had decayed away. Pt states that she has been following up with a gum disease specialist and states that the abscess and subsequent complications were resolved as of 2 months ago.     Patient Active Problem List   Diagnosis Date Noted   Osteoarthritis 08/28/2015   Thinning hair 02/17/2015   Borderline hyperglycemia 02/17/2015    Past Medical History:  Diagnosis Date   Allergy    Arthritis     Past Surgical History:  Procedure Laterality Date   ABDOMINAL HYSTERECTOMY     APPENDECTOMY     CESAREAN SECTION     FRACTURE SURGERY     SPINE SURGERY      Social History  Substance Use  Topics   Smoking status: Never Smoker   Smokeless tobacco: Not on file   Alcohol use No    Family History  Problem Relation Age of Onset   Heart disease Mother    Hyperlipidemia Mother    Diabetes Father     Allergies  Allergen Reactions   Codeine     Medication list has been reviewed and updated.  Current Outpatient Prescriptions on File Prior to Visit  Medication Sig Dispense Refill   Ascorbic Acid (VITAMIN C) 100 MG tablet Take 100 mg by mouth daily.     aspirin 81 MG tablet Take 81 mg by mouth daily.     calcium carbonate (OS-CAL) 600 MG TABS tablet Take 600 mg by mouth 2 (two) times daily with a meal.     cetirizine (ZYRTEC) 10 MG tablet TAKE 1 TABLET BY MOUTH EVERY DAY 90 tablet 1   cholecalciferol (VITAMIN D) 1000 UNITS tablet Take 1,000 Units by mouth daily.     levofloxacin (LEVAQUIN) 500 MG tablet Take 1 tablet (500 mg total) by mouth daily. 7 tablet 0   omeprazole (PRILOSEC) 10 MG capsule Take 10 mg by mouth daily.     spironolactone (ALDACTONE) 100 MG tablet TAKE 1 TABLET BY MOUTH EVERY DAY 90 tablet 0   cyclobenzaprine (FLEXERIL) 5 MG tablet  Take 1-2 tablets (5-10 mg total) by mouth 3 (three) times daily as needed for muscle spasms. (Patient not taking: Reported on 06/28/2016) 30 tablet 0   predniSONE (DELTASONE) 20 MG tablet Two daily with food (Patient not taking: Reported on 06/28/2016) 10 tablet 0   spironolactone (ALDACTONE) 100 MG tablet TAKE 1 TABLET BY MOUTH EVERY DAY (Patient not taking: Reported on 06/28/2016) 90 tablet 1   No current facility-administered medications on file prior to visit.     Review of Systems  Constitutional: Negative for fever.  HENT: Positive for ear pain (rt). Negative for ear discharge and hearing loss.   Neurological: Negative for dizziness.     Physical Examination: BP 115/74 (BP Location: Left Arm, Patient Position: Sitting, Cuff Size: Normal)    Pulse 86    Temp 97.8 F (36.6 C) (Oral)    Resp 16    Ht 5'  2.5" (1.588 m)    Wt 149 lb 6.4 oz (67.8 kg)    SpO2 96%    BMI 26.89 kg/m  Ideal Body Weight: @FLOWAMB [id number]  Physical Exam  Constitutional: She is oriented to person, place, and time. She appears well-developed and well-nourished. No distress.  HENT:  Head: Normocephalic and atraumatic.  Right Ear: Tympanic membrane, external ear and ear canal normal. No swelling. Tympanic membrane is not erythematous and not bulging. No middle ear effusion.  Left Ear: Tympanic membrane, external ear and ear canal normal. No swelling. Tympanic membrane is not erythematous and not bulging.  No middle ear effusion.  Mouth/Throat: Oropharynx is clear and moist and mucous membranes are normal. No oropharyngeal exudate or posterior oropharyngeal erythema.  Right TMJ tenderness. No redness, no changes in skin texture around rt TMJ. Note that right and left TMJ are not symmetric.  Rt upper gingiva above the molars without erythema. There is an indented deformity consistent with pt's history of bone loss secondary to infection.   Eyes: Conjunctivae and EOM are normal. Pupils are equal, round, and reactive to light.  Cardiovascular: Normal rate.   Pulmonary/Chest: Effort normal. No respiratory distress.  Neurological: She is alert and oriented to person, place, and time.  Skin: She is not diaphoretic.  Psychiatric: She has a normal mood and affect. Her behavior is normal.     Assessment and Plan: Maria Bautista is a 65 y.o. female who is here today for cc of right jaw and ear pain. Advised tylenol and tmj treatment, however she should have consult with a maxillofacial surgeon.    Arthralgia of right temporomandibular joint  Ivar Drape, PA-C [facility] 8/7/20171:56 PM

## 2016-06-28 NOTE — Patient Instructions (Addendum)
IF you received an x-ray today, you will receive an invoice from [facility]. Please contact [facility] at [phone] with questions or concerns regarding your invoice.   IF you received labwork today, you will receive an invoice from [employer]. Please contact [facility] at [phone] with questions or concerns regarding your invoice.   Our billing staff will not be able to assist you with questions regarding bills from these companies.  You will be contacted with the lab results as soon as they are available. The fastest way to get your results is to activate your My Chart account. Instructions are located on the last page of this paperwork. If you have not heard from [place] regarding the results in 2 weeks, please contact this office.    Please use tylenol for the jaw pain, as needed.  Switch from ice to heat for 15 minutes.  You can try the flexeril to see if this adds any relief.  I am going to contact an oral medical provider for this.  I am including information about TMJ.  It is not to say this is the culprit for sure, but certainly can be managed at home in that same regard. Please return within 6 months for an annual physical.  Temporomandibular Joint Syndrome Temporomandibular joint (TMJ) syndrome is a condition that affects the joints between your jaw and your skull. The TMJs are located near your ears and allow your jaw to open and close. These joints and the nearby muscles are involved in all movements of the jaw. People with TMJ syndrome have pain in the area of these joints and muscles. Chewing, biting, or other movements of the jaw can be difficult or painful. TMJ syndrome can be caused by various things. In many cases, the condition is mild and goes away within a few weeks. For some people, the condition can become a long-term problem. CAUSES Possible causes of TMJ syndrome include:  Grinding your teeth or clenching your jaw. Some  people do this when they are under stress.  Arthritis.  Injury to the jaw.  Head or neck injury.  Teeth or dentures that are not aligned well. In some cases, the cause of TMJ syndrome may not be known. SIGNS AND SYMPTOMS The most common symptom is an aching pain on the side of the head in the area of the TMJ. Other symptoms may include:  Pain when moving your jaw, such as when chewing or biting.  Being unable to open your jaw all the way.  Making a clicking sound when you open your mouth.  Headache.  Earache.  Neck or shoulder pain. DIAGNOSIS Diagnosis can usually be made based on your symptoms, your medical history, and a physical exam. Your health care provider may check the range of motion of your jaw. Imaging tests, such as X-rays or an MRI, are sometimes done. You may need to see your dentist to determine if your teeth and jaw are lined up correctly. TREATMENT TMJ syndrome often goes away on its own. If treatment is needed, the options may include:  Eating soft foods and applying ice or heat.  Medicines to relieve pain or inflammation.  Medicines to relax the muscles.  A splint, bite plate, or mouthpiece to prevent teeth grinding or jaw clenching.  Relaxation techniques or counseling to help reduce stress.  Transcutaneous electrical nerve stimulation (TENS). This helps to relieve pain by applying an electrical current through the skin.  Acupuncture. This is sometimes helpful  to relieve pain.  Jaw surgery. This is rarely needed. HOME CARE INSTRUCTIONS  Take medicines only as directed by your health care provider.  Eat a soft diet if you are having trouble chewing.  Apply ice to the painful area.  Put ice in a plastic bag.  Place a towel between your skin and the bag.  Leave the ice on for 20 minutes, 2-3 times a day.  Apply a warm compress to the painful area as directed.  Massage your jaw area and perform any jaw stretching exercises as recommended by  your health care provider.  If you were given a mouthpiece or bite plate, wear it as directed.  Avoid foods that require a lot of chewing. Do not chew gum.  Keep all follow-up visits as directed by your health care provider. This is important. SEEK MEDICAL CARE IF:  You are having trouble eating.  You have new or worsening symptoms. SEEK IMMEDIATE MEDICAL CARE IF:  Your jaw locks open or closed.   This information is not intended to replace advice given to you by your health care provider. Make sure you discuss any questions you have with your health care provider.   Document Released: 08/03/2001 Document Revised: 11/29/2014 Document Reviewed: 06/13/2014 Elsevier Interactive Patient Education [employer].

## 2016-07-22 ENCOUNTER — Encounter: Payer: Medicare Other | Admitting: Family Medicine

## 2016-09-23 ENCOUNTER — Other Ambulatory Visit: Payer: Self-pay

## 2016-09-23 NOTE — Telephone Encounter (Signed)
05/2016 last ov and lab

## 2016-09-27 MED ORDER — SPIRONOLACTONE 100 MG PO TABS: 100.0000 mg | ORAL_TABLET | Freq: Every day | ORAL | 0 refills | Status: DC

## 2016-09-27 NOTE — Telephone Encounter (Signed)
I have never seen this pt. Please see who she saw last and who last rx'd her spironolactone and sent to that provider. Thanks.

## 2016-11-23 ENCOUNTER — Encounter: Payer: Self-pay | Admitting: Physician Assistant

## 2016-11-23 ENCOUNTER — Ambulatory Visit (INDEPENDENT_AMBULATORY_CARE_PROVIDER_SITE_OTHER): Payer: Medicare Other | Admitting: Physician Assistant

## 2016-11-23 VITALS — BP 119/74 | HR 84 | Temp 98.4°F | Ht 62.5 in | Wt 146.6 lb

## 2016-11-23 DIAGNOSIS — Z8639 Personal history of other endocrine, nutritional and metabolic disease: Secondary | ICD-10-CM | POA: Diagnosis not present

## 2016-11-23 DIAGNOSIS — J3489 Other specified disorders of nose and nasal sinuses: Secondary | ICD-10-CM | POA: Diagnosis not present

## 2016-11-23 DIAGNOSIS — Z131 Encounter for screening for diabetes mellitus: Secondary | ICD-10-CM

## 2016-11-23 DIAGNOSIS — Z13228 Encounter for screening for other metabolic disorders: Secondary | ICD-10-CM

## 2016-11-23 DIAGNOSIS — Z13220 Encounter for screening for lipoid disorders: Secondary | ICD-10-CM

## 2016-11-23 DIAGNOSIS — Z130 Encounter for screening for diseases of the blood and blood-forming organs and certain disorders involving the immune mechanism: Secondary | ICD-10-CM | POA: Diagnosis not present

## 2016-11-23 DIAGNOSIS — Z0000 Encounter for general adult medical examination without abnormal findings: Secondary | ICD-10-CM

## 2016-11-23 DIAGNOSIS — Z13820 Encounter for screening for osteoporosis: Secondary | ICD-10-CM | POA: Diagnosis not present

## 2016-11-23 DIAGNOSIS — Z1211 Encounter for screening for malignant neoplasm of colon: Secondary | ICD-10-CM | POA: Diagnosis not present

## 2016-11-23 NOTE — Progress Notes (Signed)
[facility] [address] [place] [address] [phone]  Date:  11/23/2016   Name:  Maria Bautista   DOB:  1951/07/03   MRN:  [id number]  PCP:  Delman Cheadle, MD    History of Present Illness:  Maria Bautista is a 66 y.o. female patient who presents to [facility] for cc of annual physical exam.    Diet: no restrictions.  Some fast food.  Eat lean cuisines for dinner.  Eats a lot of bread.  She has salads three times per week.  Protein smoothies.  Water intake 5 glasses per day.  No sodas  BM: normal.  Regular.  No blood or black in stool.  Urination: normal.  Frequency not abnormal, no hematuria or dysuria  Sleep: sporadic.  Doze but not fatigued.  She gets 9-10 hours per night.  May get up a couple times to urinate.    Social activity:  meetups with new girlfriends.  Recently dancing.  Etoh: none  48 found enlarged thyroid.  Benign nodules and cyst of the thyroid found with radioiodine imaging, and later biopsy.  She was seen for hx of thinning hair, hoarseness, and throat pain.  No chest pain or palpitations were noted.  She was placed on a thyroid medication despite normal blood work, she reports--this helped her symptoms greatly.  When she went to a new individual, she was taken off the thyroid medication.  She states she continues to have symptoms of hoarseness and throat pain at times.    Concern of lesion at nose.  She has her glasses at this area, but it is red and non-painful present for months.  Generally wears sunscreen and not enormous sun exposure.    Patient Active Problem List   Diagnosis Date Noted  . Osteoarthritis 08/28/2015  . Thinning hair 02/17/2015  . Borderline hyperglycemia 02/17/2015    Past Medical History:  Diagnosis Date  . Allergy   . Arthritis     Past Surgical History:  Procedure Laterality Date  . ABDOMINAL HYSTERECTOMY    . APPENDECTOMY    . CESAREAN SECTION    . FRACTURE SURGERY    . SPINE SURGERY      Social History   Substance Use Topics  . Smoking status: Never Smoker  . Smokeless tobacco: Never Used  . Alcohol use No    Family History  Problem Relation Age of Onset  . Heart disease Mother   . Hyperlipidemia Mother   . Diabetes Father     Allergies  Allergen Reactions  . Codeine     Medication list has been reviewed and updated.  Current Outpatient Prescriptions on File Prior to Visit  Medication Sig Dispense Refill  . Ascorbic Acid (VITAMIN C) 100 MG tablet Take 100 mg by mouth daily.    Marland Kitchen aspirin 81 MG tablet Take 81 mg by mouth daily.    . calcium carbonate (OS-CAL) 600 MG TABS tablet Take 600 mg by mouth 2 (two) times daily with a meal.    . cetirizine (ZYRTEC) 10 MG tablet TAKE 1 TABLET BY MOUTH EVERY DAY 90 tablet 1  . cholecalciferol (VITAMIN D) 1000 UNITS tablet Take 1,000 Units by mouth daily.    Marland Kitchen omeprazole (PRILOSEC) 10 MG capsule Take 10 mg by mouth daily.    Marland Kitchen spironolactone (ALDACTONE) 100 MG tablet TAKE 1 TABLET BY MOUTH EVERY DAY 90 tablet 0  . spironolactone (ALDACTONE) 100 MG tablet Take 1 tablet (100 mg total) by mouth daily.  Please come in for an office visit for additional refills 90 tablet 0  . cyclobenzaprine (FLEXERIL) 5 MG tablet Take 1-2 tablets (5-10 mg total) by mouth 3 (three) times daily as needed for muscle spasms. (Patient not taking: Reported on 11/23/2016) 30 tablet 0  . levofloxacin (LEVAQUIN) 500 MG tablet Take 1 tablet (500 mg total) by mouth daily. (Patient not taking: Reported on 11/23/2016) 7 tablet 0  . predniSONE (DELTASONE) 20 MG tablet Two daily with food (Patient not taking: Reported on 11/23/2016) 10 tablet 0   No current facility-administered medications on file prior to visit.     Review of Systems  Constitutional: Negative for chills and fever.  HENT: Negative for ear discharge, ear pain and sore throat.   Eyes: Negative for blurred vision and double vision.  Respiratory: Negative for cough, shortness of breath and wheezing.    Cardiovascular: Negative for chest pain, palpitations and leg swelling.  Gastrointestinal: Negative for diarrhea, nausea and vomiting.  Genitourinary: Negative for dysuria, frequency and hematuria.  Skin: Negative for itching and rash.  Neurological: Negative for dizziness and headaches.     Physical Examination: BP 119/74 (BP Location: Right Arm, Patient Position: Sitting, Cuff Size: Small)   Pulse 84   Temp 98.4 F (36.9 C) (Oral)   Ht 5' 2.5" (1.588 m)   Wt 146 lb 9.6 oz (66.5 kg)   SpO2 97%   BMI 26.39 kg/m  Ideal Body Weight: Weight in (lb) to have BMI = 25: 138.6  Physical Exam  Constitutional: She is oriented to person, place, and time. She appears well-developed and well-nourished. No distress.  HENT:  Head: Normocephalic and atraumatic.  Right Ear: Tympanic membrane, external ear and ear canal normal.  Left Ear: Tympanic membrane, external ear and ear canal normal.  Nose: Right sinus exhibits no maxillary sinus tenderness and no frontal sinus tenderness. Left sinus exhibits no maxillary sinus tenderness and no frontal sinus tenderness.  Mouth/Throat: Oropharynx is clear and moist. No uvula swelling. No oropharyngeal exudate, posterior oropharyngeal edema or posterior oropharyngeal erythema.  Eyes: Conjunctivae and EOM are normal. Pupils are equal, round, and reactive to light.  Neck: Normal range of motion. Neck supple. No thyroid mass and no thyromegaly present.  Cardiovascular: Normal rate, regular rhythm, normal heart sounds and intact distal pulses.  Exam reveals no gallop, no distant heart sounds and no friction rub.   No murmur heard. Pulmonary/Chest: Effort normal and breath sounds normal. No respiratory distress. She has no decreased breath sounds. She has no wheezes. She has no rhonchi.  Abdominal: Soft. Bowel sounds are normal. She exhibits no distension and no mass. There is no tenderness.  Musculoskeletal: Normal range of motion. She exhibits no edema or  tenderness.  Lymphadenopathy:       Head (right side): No submandibular, no tonsillar, no preauricular and no posterior auricular adenopathy present.       Head (left side): No submandibular, no tonsillar, no preauricular and no posterior auricular adenopathy present.    She has no cervical adenopathy.  Neurological: She is alert and oriented to person, place, and time. No cranial nerve deficit. She exhibits normal muscle tone. Coordination normal.  Skin: Skin is warm and dry. She is not diaphoretic.  Left side of the nasal bridge with non raised capillaries that blanche.  Pinpoint area in center of central broken capillaries.    Psychiatric: She has a normal mood and affect. Her behavior is normal.     Assessment and Plan: Norville Haggard  is a 66 y.o. female who is here today for cc of annual physical exam. She expresses that she would like the cologuard instead of colonoscopy. Advised with sorted hx, that we should recheck the thyroid by a specialist.  I will attempt to obtain the information from past visits in [place], [place]--though she does not recall the locations of visits.  At this time, given hx of found nodules, follow up with endocrinology for consult is warranted.  Thanks.  This may be secondary to reflux...however, with findings on imaging reported... She has upto date Prevnar and pneumococcal from pharmacy. Declines influenza vaccine.   Annual physical exam - Plan: CBC, CMP14+EGFR, TSH, Lipid panel, VITAMIN D 25 Hydroxy (Vit-D Deficiency, Fractures), Hemoglobin A1c, Ambulatory referral to Endocrinology, Cologuard  Hx of thyroid nodule - Plan: Ambulatory referral to Endocrinology  Special screening for malignant neoplasms, colon - Plan: Cologuard  Screening for deficiency anemia - Plan: CBC  Screening for metabolic disorder - Plan: CMP14+EGFR  Screening for lipid disorders - Plan: Lipid panel  Screening for osteoporosis - Plan: VITAMIN D 25 Hydroxy (Vit-D Deficiency,  Fractures)  Screening for diabetes mellitus - Plan: Hemoglobin A1c  Ivar Drape, PA-C [facility] 1/2/201811:19 AM

## 2016-11-23 NOTE — Patient Instructions (Addendum)
   IF you received an x-ray today, you will receive an invoice from [facility]. Please contact [facility] at [phone] with questions or concerns regarding your invoice.   IF you received labwork today, you will receive an invoice from [facility]. Please contact [facility] at [phone] with questions or concerns regarding your invoice.   Our billing staff will not be able to assist you with questions regarding bills from these companies.  You will be contacted with the lab results as soon as they are available. The fastest way to get your results is to activate your My Chart account. Instructions are located on the last page of this paperwork. If you have not heard from us regarding the results in 2 weeks, please contact this office.     Keeping You Healthy  Get These Tests  Blood Pressure- Have your blood pressure checked by your healthcare provider at least once a year.  Normal blood pressure is 120/80.  Weight- Have your body mass index (BMI) calculated to screen for obesity.  BMI is a measure of body fat based on height and weight.  You can calculate your own BMI at www.nhlbisupport.com/bmi/  Cholesterol- Have your cholesterol checked every year.  Diabetes- Have your blood sugar checked every year if you have high blood pressure, high cholesterol, a family history of diabetes or if you are overweight.  Pap Test - Have a pap test every 1 to 5 years if you have been sexually active.  If you are older than 65 and recent pap tests have been normal you may not need additional pap tests.  In addition, if you have had a hysterectomy  for benign disease additional pap tests are not necessary.  Mammogram-Yearly mammograms are essential for early detection of breast cancer  Screening for Colon Cancer- Colonoscopy starting at age 50. Screening may begin sooner depending on your family history and other health conditions.  Follow up colonoscopy as directed by your  Gastroenterologist.  Screening for Osteoporosis- Screening begins at age 65 with bone density scanning, sooner if you are at higher risk for developing Osteoporosis.  Get these medicines  Calcium with Vitamin D- Your body requires 1200-1500 mg of Calcium a day and 800-1000 IU of Vitamin D a day.  You can only absorb 500 mg of Calcium at a time therefore Calcium must be taken in 2 or 3 separate doses throughout the day.  Hormones- Hormone therapy has been associated with increased risk for certain cancers and heart disease.  Talk to your healthcare provider about if you need relief from menopausal symptoms.  Aspirin- Ask your healthcare provider about taking Aspirin to prevent Heart Disease and Stroke.  Get these Immuniztions  Flu shot- Every fall  Pneumonia shot- Once after the age of 65; if you are younger ask your healthcare provider if you need a pneumonia shot.  Tetanus- Every ten years.  Zostavax- Once after the age of 60 to prevent shingles.  Take these steps  Don't smoke- Your healthcare provider can help you quit. For tips on how to quit, ask your healthcare provider or go to www.smokefree.gov or call 1-800 QUIT-NOW.  Be physically active- Exercise 5 days a week for a minimum of 30 minutes.  If you are not already physically active, start slow and gradually work up to 30 minutes of moderate physical activity.  Try walking, dancing, bike riding, swimming, etc.  Eat a healthy diet- Eat a variety of healthy foods such as fruits, vegetables, whole grains, low   fat milk, low fat cheeses, yogurt, lean meats, chicken, fish, eggs, dried beans, tofu, etc.  For more information go to www.thenutritionsource.org  Dental visit- Brush and floss teeth twice daily; visit your dentist twice a year.  Eye exam- Visit your Optometrist or Ophthalmologist yearly.  Drink alcohol in moderation- Limit alcohol intake to one drink or less a day.  Never drink and drive.  Depression- Your emotional  health is as important as your physical health.  If you're feeling down or losing interest in things you normally enjoy, please talk to your healthcare provider.  Seat Belts- can save your life; always wear one  Smoke/Carbon Monoxide detectors- These detectors need to be installed on the appropriate level of your home.  Replace batteries at least once a year.  Violence- If anyone is threatening or hurting you, please tell your healthcare provider.  Living Will/ Health care power of attorney- Discuss with your healthcare provider and family.  

## 2016-11-24 LAB — CMP14+EGFR
ALT: 23 IU/L (ref 0–32)
AST: 22 IU/L (ref 0–40)
Albumin/Globulin Ratio: 1.7 (ref 1.2–2.2)
Albumin: 4.7 g/dL (ref 3.6–4.8)
Alkaline Phosphatase: 80 IU/L (ref 39–117)
BUN/Creatinine Ratio: 14 (ref 12–28)
BUN: 11 mg/dL (ref 8–27)
Bilirubin Total: 0.6 mg/dL (ref 0.0–1.2)
CO2: 25 mmol/L (ref 18–29)
Calcium: 10.6 mg/dL — ABNORMAL HIGH (ref 8.7–10.3)
Chloride: 101 mmol/L (ref 96–106)
Creatinine, Ser: 0.79 mg/dL (ref 0.57–1.00)
GFR calc Af Amer: 90 mL/min/{1.73_m2} (ref >59)
GFR calc non Af Amer: 78 mL/min/{1.73_m2} (ref >59)
Globulin, Total: 2.7 g/dL (ref 1.5–4.5)
Glucose: 97 mg/dL (ref 65–99)
Potassium: 4.9 mmol/L (ref 3.5–5.2)
Sodium: 139 mmol/L (ref 134–144)
Total Protein: 7.4 g/dL (ref 6.0–8.5)

## 2016-11-24 LAB — CBC
Hematocrit: 41 % (ref 34.0–46.6)
Hemoglobin: 13.8 g/dL (ref 11.1–15.9)
MCH: 29.4 pg (ref 26.6–33.0)
MCHC: 33.7 g/dL (ref 31.5–35.7)
MCV: 87 fL (ref 79–97)
Platelets: 377 10*3/uL (ref 150–379)
RBC: 4.7 x10E6/uL (ref 3.77–5.28)
RDW: 13 % (ref 12.3–15.4)
WBC: 5.1 10*3/uL (ref 3.4–10.8)

## 2016-11-24 LAB — LIPID PANEL
Chol/HDL Ratio: 3.5 ratio units (ref 0.0–4.4)
Cholesterol, Total: 244 mg/dL — ABNORMAL HIGH (ref 100–199)
HDL: 69 mg/dL (ref >39)
LDL Calculated: 164 mg/dL — ABNORMAL HIGH (ref 0–99)
Triglycerides: 57 mg/dL (ref 0–149)
VLDL Cholesterol Cal: 11 mg/dL (ref 5–40)

## 2016-11-24 LAB — VITAMIN D 25 HYDROXY (VIT D DEFICIENCY, FRACTURES): Vit D, 25-Hydroxy: 51.5 ng/mL (ref 30.0–100.0)

## 2016-11-24 LAB — HEMOGLOBIN A1C
Est. average glucose Bld gHb Est-mCnc: 108 mg/dL
Hgb A1c MFr Bld: 5.4 % (ref 4.8–5.6)

## 2016-11-24 LAB — TSH: TSH: 0.38 u[IU]/mL — ABNORMAL LOW (ref 0.450–4.500)

## 2016-11-25 MED ORDER — IPRATROPIUM BROMIDE 0.03 % NA SOLN: 2.0000 | Freq: Two times a day (BID) | NASAL | 0 refills | Status: DC

## 2016-12-01 ENCOUNTER — Other Ambulatory Visit: Payer: Self-pay | Admitting: Family Medicine

## 2016-12-01 DIAGNOSIS — Z1231 Encounter for screening mammogram for malignant neoplasm of breast: Secondary | ICD-10-CM

## 2016-12-07 ENCOUNTER — Other Ambulatory Visit: Payer: Self-pay | Admitting: Family Medicine

## 2016-12-07 ENCOUNTER — Other Ambulatory Visit: Payer: Self-pay | Admitting: Emergency Medicine

## 2016-12-07 DIAGNOSIS — L659 Nonscarring hair loss, unspecified: Secondary | ICD-10-CM

## 2016-12-07 DIAGNOSIS — T502X5A Adverse effect of carbonic-anhydrase inhibitors, benzothiadiazides and other diuretics, initial encounter: Secondary | ICD-10-CM

## 2016-12-07 DIAGNOSIS — E876 Hypokalemia: Secondary | ICD-10-CM

## 2016-12-07 DIAGNOSIS — I10 Essential (primary) hypertension: Secondary | ICD-10-CM

## 2016-12-07 MED ORDER — SPIRONOLACTONE 100 MG PO TABS: 100.0000 mg | ORAL_TABLET | Freq: Every day | ORAL | 3 refills | Status: DC

## 2016-12-07 NOTE — Addendum Note (Signed)
Addended by: Ivar Drape D on: 12/07/2016 01:20 PM   Modules accepted: Orders

## 2016-12-07 NOTE — Telephone Encounter (Signed)
Refilling her medication.  I saw her within the last 2 weeks.  Thank you for letting us know.

## 2016-12-07 NOTE — Telephone Encounter (Signed)
Received refill request for spironolactone (ALDACTONE) 100 MG tablet. Pt last seen 08/08/15 and last refill 03/08/16. Are you ok with refill? Please advise.

## 2016-12-09 ENCOUNTER — Other Ambulatory Visit: Payer: Self-pay | Admitting: Physician Assistant

## 2016-12-09 DIAGNOSIS — R7989 Other specified abnormal findings of blood chemistry: Secondary | ICD-10-CM

## 2016-12-09 DIAGNOSIS — Z8639 Personal history of other endocrine, nutritional and metabolic disease: Secondary | ICD-10-CM

## 2016-12-10 ENCOUNTER — Other Ambulatory Visit: Payer: Self-pay | Admitting: Physician Assistant

## 2016-12-15 ENCOUNTER — Other Ambulatory Visit: Payer: Self-pay

## 2016-12-15 DIAGNOSIS — L659 Nonscarring hair loss, unspecified: Secondary | ICD-10-CM

## 2016-12-15 NOTE — Telephone Encounter (Signed)
Pt would like her spironolactone (ALDACTONE) 100 MG tablet [id number] sent to [facility].  Her new insurance plan allows her to get her scripts there for free. Please advise at [phone]

## 2016-12-16 ENCOUNTER — Other Ambulatory Visit: Payer: Self-pay

## 2016-12-16 NOTE — Telephone Encounter (Signed)
Fax req [facility]  Spironolactone Already refilled - seen 11/23/2016

## 2016-12-17 NOTE — Telephone Encounter (Signed)
Last seen and labs 11/2016

## 2016-12-18 MED ORDER — SPIRONOLACTONE 100 MG PO TABS: 100.0000 mg | ORAL_TABLET | Freq: Every day | ORAL | 3 refills | Status: DC

## 2016-12-18 NOTE — Telephone Encounter (Signed)
I have never seen this pt. She saw Colletta Maryland sev wks ago who sent a year of spironolactone to the local pharmacy so I"m sure it would be fine with her if you just sent it in to the mail order pharm instead

## 2016-12-20 MED ORDER — SPIRONOLACTONE 100 MG PO TABS: 100.0000 mg | ORAL_TABLET | Freq: Every day | ORAL | 3 refills | Status: DC

## 2016-12-20 NOTE — Telephone Encounter (Signed)
sent 

## 2016-12-20 NOTE — Addendum Note (Signed)
Addended by: Virgia Land on: 12/20/2016 08:08 AM   Modules accepted: Orders

## 2016-12-21 ENCOUNTER — Other Ambulatory Visit: Payer: Medicare Other

## 2016-12-21 DIAGNOSIS — R7989 Other specified abnormal findings of blood chemistry: Secondary | ICD-10-CM

## 2016-12-21 DIAGNOSIS — Z8639 Personal history of other endocrine, nutritional and metabolic disease: Secondary | ICD-10-CM | POA: Diagnosis not present

## 2016-12-21 DIAGNOSIS — R946 Abnormal results of thyroid function studies: Secondary | ICD-10-CM | POA: Diagnosis not present

## 2016-12-22 LAB — T3FREE: Triiodothyronine, Free, Serum: 3.7 pg/mL (ref 2.0–4.4)

## 2016-12-22 LAB — THYROXINE (T4) FREE, DIRECT: T4,Free (Direct): 1.23 ng/dL (ref 0.82–1.77)

## 2016-12-22 LAB — THYROID CASCADE PROFILE: TSH: 0.448 u[IU]/mL — ABNORMAL LOW (ref 0.450–4.500)

## 2017-01-03 ENCOUNTER — Telehealth: Payer: Self-pay

## 2017-01-03 NOTE — Telephone Encounter (Signed)
PATIENT WOULD LIKE STEPHANIE TO GIVE HER THE RESULTS OF HER REPEAT THYROID TEST RESULTS. BEST PHONE [phone] (CELL) [place]

## 2017-01-03 NOTE — Telephone Encounter (Signed)
See abnormal thyroid results and advise

## 2017-01-05 NOTE — Telephone Encounter (Signed)
Patient's tsh is minimally low, representing hyperthyroidism.  At this time, rather than start therapy where endocrinologist will want additional test, and may warrant other forms of treatment--we will defer to endocrinology which she has an appointment in less than 4 weeks.

## 2017-01-05 NOTE — Telephone Encounter (Signed)
L/m with stephanies note

## 2017-01-06 NOTE — Telephone Encounter (Signed)
PATIENT WOULD LIKE STEPHANIE TO KNOW THAT SHE MADE AN APPOINTMENT FOR THE ENDOCRINOLOGIST (DR. Clydene Pugh) FOR January 27, 2017, BUT SHE IS GOING TO CANCEL IT. SHE SAID SHE WENT ON LINE AND SHE DID NOT SEE ANYTHING FAVORABLE ABOUT HIM. SHE WOULD LIKE STEPHANIE TO REFER HER SOMEWHERE ELSE WHO TAKES HER MEDICARE. BEST PHONE [phone] (CELL) [place]

## 2017-01-07 ENCOUNTER — Encounter: Payer: Self-pay | Admitting: Endocrinology

## 2017-01-07 NOTE — Telephone Encounter (Signed)
I will send her referral to [facility] today.

## 2017-01-07 NOTE — Telephone Encounter (Signed)
fyi

## 2017-01-11 ENCOUNTER — Ambulatory Visit
Admission: RE | Admit: 2017-01-11 | Discharge: 2017-01-11 | Disposition: A | Discharge status: Home / Self Care | Payer: Medicare Other | Source: Ambulatory Visit | Attending: Family Medicine | Admitting: Family Medicine

## 2017-01-11 DIAGNOSIS — Z1231 Encounter for screening mammogram for malignant neoplasm of breast: Secondary | ICD-10-CM

## 2017-01-16 ENCOUNTER — Telehealth: Payer: Self-pay | Admitting: Physician Assistant

## 2017-01-16 NOTE — Telephone Encounter (Addendum)
Telephone services 9:50am of abdominal pain and diarrhea.  Contacted pt at 9:52 Patient reports that she has abdominal pain that woke her up from sleep last night.  pain located at the middle of stomach, but has localized to rlq.  She reports a stabbing pain that can leav her sob.  It is intermittently present but throughout the day.  She has denied any diarrhea.   No redness or swelling, or bruising she can visualize. She has no fever or nausea. No dysuria, hematuria.  Some frequency though she is drinking a lot of water as she is moving.   No abnormal vaginal discharge She reports appendectomy years ago. Advised of alarming sxs to warrant immediate visit to ED.  Tylenol or ibuprofen for pain. If she can tolerate and alarming sxs do not occur--she will come to PCP.

## 2017-01-17 ENCOUNTER — Ambulatory Visit (INDEPENDENT_AMBULATORY_CARE_PROVIDER_SITE_OTHER): Payer: Medicare Other | Admitting: Physician Assistant

## 2017-01-17 VITALS — BP 122/70 | HR 87 | Temp 98.3°F | Resp 18 | Ht 62.5 in | Wt 148.0 lb

## 2017-01-17 DIAGNOSIS — R109 Unspecified abdominal pain: Secondary | ICD-10-CM | POA: Diagnosis not present

## 2017-01-17 NOTE — Progress Notes (Signed)
Patient ID: Maria Bautista, female    DOB: 03-22-1951, 66 y.o.   MRN: [id number]  PCP: Ivar Drape, PA  Chief Complaint  Patient presents with  . Abdominal Pain    Subjective:   Presents for evaluation of abdominal pain.  Began at 11:30 pm Friday 01/14/2017. Seemed to improve by about 9:30 am. Contacted PCP via answering service. And was advised to come in today for evaluation if symptoms did not worsen, in which case she was to go to the ED. Started to eat, and took ibuprofen with good results. Went to bed early (8 pm) and awoke today at 6 am. She has tolerated oral hydration and nutrition. Had 3 large bowel movements yesterday, which is unusual for her. Lifetime of irregular BMs. Doesn't like to medications she's tried to address chronic constipation because of cramping and bloating.  Pain is now completely resolved. No nausea. No fever, chills. No urinary urgency, frequency or burning.  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   In addition: 1. LEFT heel pain. Topical product with minimal benefit. 2. Cologard was ordered at her last visit 11/2016, but she hasn't received the kit. 3. Endocrinology. Was referred in January. After reading the provider's ratings online, she decided not to keep that appointment. She wonders what the next step may be. 4. She completed a request for records at her January visit and wants to know if we have received them from her previous providers in [place].    Review of Systems As above. Symptoms are resolved.    Patient Active Problem List   Diagnosis Date Noted  . Osteoarthritis 08/28/2015  . Thinning hair 02/17/2015  . Borderline hyperglycemia 02/17/2015     Prior to Admission medications   Medication Sig Start Date End Date Taking? Authorizing Provider  Ascorbic Acid (VITAMIN C) 100 MG tablet Take 100 mg by mouth daily.   Yes Historical Provider, MD  aspirin 81 MG tablet Take 81 mg by mouth daily.   Yes Historical  Provider, MD  calcium carbonate (OS-CAL) 600 MG TABS tablet Take 600 mg by mouth 2 (two) times daily with a meal.   Yes Historical Provider, MD  cetirizine (ZYRTEC) 10 MG tablet TAKE 1 TABLET BY MOUTH EVERY DAY 12/10/16  Yes Shawnee Knapp, MD  cholecalciferol (VITAMIN D) 1000 UNITS tablet Take 1,000 Units by mouth daily.   Yes Historical Provider, MD  cyclobenzaprine (FLEXERIL) 5 MG tablet Take 1-2 tablets (5-10 mg total) by mouth 3 (three) times daily as needed for muscle spasms. 01/10/16  Yes Stephanie D English, PA  ipratropium (ATROVENT) 0.03 % nasal spray Place 2 sprays into both nostrils 2 (two) times daily. 11/25/16  Yes Dorian Heckle English, PA  omeprazole (PRILOSEC) 10 MG capsule Take 10 mg by mouth daily.   Yes Historical Provider, MD  spironolactone (ALDACTONE) 100 MG tablet Take 1 tablet (100 mg total) by mouth daily. 12/20/16  Yes Dorian Heckle English, PA     Allergies  Allergen Reactions  . Codeine        Objective:  Physical Exam  Constitutional: She is oriented to person, place, and time. She appears well-developed and well-nourished. She is active and cooperative. No distress.  BP 122/70 (BP Location: Right Arm, Patient Position: Sitting, Cuff Size: Small)   Pulse 87   Temp 98.3 F (36.8 C) (Oral)   Resp 18   Ht 5' 2.5" (1.588 m)   Wt 148 lb (67.1 kg)   SpO2 98%  BMI 26.64 kg/m   HENT:  Head: Normocephalic and atraumatic.  Right Ear: Hearing normal.  Left Ear: Hearing normal.  Eyes: Conjunctivae are normal. No scleral icterus.  Neck: Normal range of motion. Neck supple. No thyromegaly present.  Cardiovascular: Normal rate, regular rhythm and normal heart sounds.   Pulses:      Radial pulses are 2+ on the right side, and 2+ on the left side.  Pulmonary/Chest: Effort normal and breath sounds normal.  Abdominal: Soft. Normal appearance and bowel sounds are normal. There is no tenderness. There is no CVA tenderness. No hernia.  Lymphadenopathy:       Head (right side):  No tonsillar, no preauricular, no posterior auricular and no occipital adenopathy present.       Head (left side): No tonsillar, no preauricular, no posterior auricular and no occipital adenopathy present.    She has no cervical adenopathy.       Right: No supraclavicular adenopathy present.       Left: No supraclavicular adenopathy present.  Neurological: She is alert and oriented to person, place, and time. No sensory deficit.  Skin: Skin is warm, dry and intact. No rash noted. No cyanosis or erythema. Nails show no clubbing.  Psychiatric: She has a normal mood and affect. Her speech is normal and behavior is normal.        Assessment & Plan:   1. Abdominal pain, unspecified abdominal location Pain has resolved. Most likely, she was experiencing some constipation, which has apparently resolved. Anticipatory guidance provided, return if symptoms recur.  She has multiple other issues today that we are not able to address. -Information about plantar fasciitis provided, with exercises. She'll read about it and see if this seems to help. If not, RTC for re-evaluation. -I signed a new form for the Cologard test -Recommended Dr. Jeanann Lewandowsky as a potential endocrinologist. She will read about him online and let me or her PCP know if she'd like to go there. If not, we'll need to refer her out of town. -I do not see records from her previous providers in [place] in the record. She'll get a new release of records consent form today and we will try again. -She needs to update her home mailing address at the front desk today.   Fara Chute, PA-C Physician Assistant-Certified [facility]

## 2017-01-17 NOTE — Patient Instructions (Addendum)
To help reduce constipation and promote bowel health, 1. Drink at least 64 ounces of water each day; 2. Eat plenty of fiber (fruits, vegetables, whole grains, legumes) 3. Get plenty of physical activity  If needed, use a stool softener (docusate) or an osmotic laxative (like Miralax) each day, or as needed.  Jeanann Lewandowsky, MD is another local endocrinologist to consider.   IF you received an x-ray today, you will receive an invoice from [facility]. Please contact [facility] at [phone] with questions or concerns regarding your invoice.   IF you received labwork today, you will receive an invoice from [facility]. Please contact [facility] at [phone] with questions or concerns regarding your invoice.   Our billing staff will not be able to assist you with questions regarding bills from these companies.  You will be contacted with the lab results as soon as they are available. The fastest way to get your results is to activate your My Chart account. Instructions are located on the last page of this paperwork. If you have not heard from [place] regarding the results in 2 weeks, please contact this office.     Plantar Fasciitis Rehab Ask your health care provider which exercises are safe for you. Do exercises exactly as told by your health care provider and adjust them as directed. It is normal to feel mild stretching, pulling, tightness, or discomfort as you do these exercises, but you should stop right away if you feel sudden pain or your pain gets worse. Do not begin these exercises until told by your health care provider. Stretching and range of motion exercises These exercises warm up your muscles and joints and improve the movement and flexibility of your foot. These exercises also help to relieve pain. Exercise A: Plantar fascia stretch 1. Sit with your left / right leg crossed over your opposite knee. 2. Hold your heel with one hand with that thumb near your arch.  With your other hand, hold your toes and gently pull them back toward the top of your foot. You should feel a stretch on the bottom of your toes or your foot or both. 3. Hold this stretch for_____5-10_____ seconds. 4. Slowly release your toes and return to the starting position. Repeat ____510______ times. Complete this exercise ____1-2______ times a day. Exercise B: Gastroc, standing 1. Stand with your hands against a wall. 2. Extend your left / right leg behind you, and bend your front knee slightly. 3. Keeping your heels on the floor and keeping your back knee straight, shift your weight toward the wall without arching your back. You should feel a gentle stretch in your left / right calf. 4. Hold this position for __________ seconds. Repeat __________ times. Complete this exercise __________ times a day. Exercise C: Soleus, standing 1. Stand with your hands against a wall. 2. Extend your left / right leg behind you, and bend your front knee slightly. 3. Keeping your heels on the floor, bend your back knee and slightly shift your weight over the back leg. You should feel a gentle stretch deep in your calf. 4. Hold this position for __________ seconds. Repeat __________ times. Complete this exercise __________ times a day. Exercise D: Gastrocsoleus, standing 1. Stand with the ball of your left / right foot on a step. The ball of your foot is on the walking surface, right under your toes. 2. Keep your other foot firmly on the same step. 3. Hold onto the wall or a railing for balance. 4. Slowly  lift your other foot, allowing your body weight to press your heel down over the edge of the step. You should feel a stretch in your left / right calf. 5. Hold this position for __________ seconds. 6. Return both feet to the step. 7. Repeat this exercise with a slight bend in your left / right knee. Repeat __________ times with your left / right knee straight and __________ times with your left / right  knee bent. Complete this exercise __________ times a day. Balance exercise This exercise builds your balance and strength control of your arch to help take pressure off your plantar fascia. Exercise E: Single leg stand 1. Without shoes, stand near a railing or in a doorway. You may hold onto the railing or door frame as needed. 2. Stand on your left / right foot. Keep your big toe down on the floor and try to keep your arch lifted. Do not let your foot roll inward. 3. Hold this position for __________ seconds. 4. If this exercise is too easy, you can try it with your eyes closed or while standing on a pillow. Repeat __________ times. Complete this exercise __________ times a day. This information is not intended to replace advice given to you by your health care provider. Make sure you discuss any questions you have with your health care provider. Document Released: 11/08/2005 Document Revised: 07/13/2016 Document Reviewed: 09/22/2015 Elsevier Interactive Patient Education  2017 [employer].

## 2017-01-27 ENCOUNTER — Ambulatory Visit: Payer: Medicare Other | Admitting: Endocrinology

## 2017-04-14 ENCOUNTER — Telehealth: Payer: Self-pay

## 2017-04-14 NOTE — Telephone Encounter (Signed)
Left message to schedule Medicare Annual Wellness Visit -nr

## 2017-05-09 DIAGNOSIS — I781 Nevus, non-neoplastic: Secondary | ICD-10-CM | POA: Diagnosis not present

## 2017-05-09 DIAGNOSIS — D1801 Hemangioma of skin and subcutaneous tissue: Secondary | ICD-10-CM | POA: Diagnosis not present

## 2017-05-30 ENCOUNTER — Ambulatory Visit (INDEPENDENT_AMBULATORY_CARE_PROVIDER_SITE_OTHER): Payer: Medicare Other | Admitting: Physician Assistant

## 2017-05-30 ENCOUNTER — Encounter: Payer: Self-pay | Admitting: Physician Assistant

## 2017-05-30 VITALS — BP 118/73 | HR 78 | Temp 98.7°F | Resp 16 | Ht 62.6 in | Wt 146.4 lb

## 2017-05-30 DIAGNOSIS — Z0000 Encounter for general adult medical examination without abnormal findings: Secondary | ICD-10-CM | POA: Diagnosis not present

## 2017-05-30 NOTE — Progress Notes (Signed)
Subjective:    Maria Bautista is a 66 y.o. female who presents for Medicare Initial preventive examination.  Patient does have some abnormal cramping of her lower legs.  Spasms that may show when she is overly exerting lower legs.  She is not hydrating well, and is in hot summer heat.  She will return if her symptoms do not improve, and perform stretches.  Tobacco History  Smoking Status  . Never Smoker  Smokeless Tobacco  . Never Used     Current Problems (verified) Patient Active Problem List   Diagnosis Date Noted  . Osteoarthritis 08/28/2015  . Thinning hair 02/17/2015  . Borderline hyperglycemia 02/17/2015    Medications Prior to Visit Current Outpatient Prescriptions on File Prior to Visit  Medication Sig Dispense Refill  . Ascorbic Acid (VITAMIN C) 100 MG tablet Take 100 mg by mouth daily.    Marland Kitchen aspirin 81 MG tablet Take 81 mg by mouth daily.    . calcium carbonate (OS-CAL) 600 MG TABS tablet Take 600 mg by mouth 2 (two) times daily with a meal.    . cetirizine (ZYRTEC) 10 MG tablet TAKE 1 TABLET BY MOUTH EVERY DAY 90 tablet 0  . cholecalciferol (VITAMIN D) 1000 UNITS tablet Take 1,000 Units by mouth daily.    . cyclobenzaprine (FLEXERIL) 5 MG tablet Take 1-2 tablets (5-10 mg total) by mouth 3 (three) times daily as needed for muscle spasms. 30 tablet 0  . ipratropium (ATROVENT) 0.03 % nasal spray Place 2 sprays into both nostrils 2 (two) times daily. 30 mL 0  . omeprazole (PRILOSEC) 10 MG capsule Take 10 mg by mouth daily.    Marland Kitchen spironolactone (ALDACTONE) 100 MG tablet Take 1 tablet (100 mg total) by mouth daily. 90 tablet 3   No current facility-administered medications on file prior to visit.     Current Medications (verified) Current Outpatient Prescriptions  Medication Sig Dispense Refill  . Ascorbic Acid (VITAMIN C) 100 MG tablet Take 100 mg by mouth daily.    Marland Kitchen aspirin 81 MG tablet Take 81 mg by mouth daily.    . calcium carbonate (OS-CAL) 600 MG TABS tablet Take  600 mg by mouth 2 (two) times daily with a meal.    . cetirizine (ZYRTEC) 10 MG tablet TAKE 1 TABLET BY MOUTH EVERY DAY 90 tablet 0  . cholecalciferol (VITAMIN D) 1000 UNITS tablet Take 1,000 Units by mouth daily.    . cyclobenzaprine (FLEXERIL) 5 MG tablet Take 1-2 tablets (5-10 mg total) by mouth 3 (three) times daily as needed for muscle spasms. 30 tablet 0  . ipratropium (ATROVENT) 0.03 % nasal spray Place 2 sprays into both nostrils 2 (two) times daily. 30 mL 0  . omeprazole (PRILOSEC) 10 MG capsule Take 10 mg by mouth daily.    Marland Kitchen spironolactone (ALDACTONE) 100 MG tablet Take 1 tablet (100 mg total) by mouth daily. 90 tablet 3   No current facility-administered medications for this visit.      Allergies (verified) Codeine   PAST HISTORY  Family History Family History  Problem Relation Age of Onset  . Heart disease Mother   . Hyperlipidemia Mother   . Diabetes Father     Social History Social History  Substance Use Topics  . Smoking status: Never Smoker  . Smokeless tobacco: Never Used  . Alcohol use No     Are there smokers in your home (other than you)? No  Risk Factors Current exercise habits: walking, planks, leg lifts, and lift  weights every night  Dietary issues discussed: no   Cardiac risk factors: advanced age (older than 93 for men, 50 for women).  Activities of Daily Living In your present state of health, do you have any difficulty performing the following activities?:  Driving? Yes Managing money?  Yes Feeding yourself? Yes Getting from bed to chair? Yes Climbing a flight of stairs? Yes Preparing food and eating?: Yes Bathing or showering? Yes Getting dressed: Yes Getting to the toilet? Yes Using the toilet:Yes Moving around from place to place: no In the past year have you fallen or had a near fall?: fell Saturday, tripping--no dizziness or fainting.  Was able to catch without injuring face.   Are you sexually active?  No  Do you have more than  one partner?  na  Hearing Difficulties: No Do you often ask people to speak up or repeat themselves?no Do you experience ringing or noises in your ears?no Do you have difficulty understanding soft or whispered voices? no   Do you feel that you have a problem with memory? No  Do you often misplace items? No  Do you feel safe at home?  Yes  Cognitive Testing  Alert? Yes  Normal Appearance?Yes  Oriented to person? Yes  Place? Yes   Time? Yes  Recall of three objects?  Yes  Can perform simple calculations? Yes  Displays appropriate judgment?Yes  Can read the correct time from a watch face?Yes   Advanced Directives have been discussed with the patient? Yes  List the Names of Other Physician/Practitioners you currently use: 1.    Indicate any recent Medical Services you may have received from other than [facility] providers in the past year (date may be approximate).  Immunization History  Administered Date(s) Administered  . Tdap 11/23/2011    Screening Tests Health Maintenance  Topic Date Due  . COLONOSCOPY  11/13/1999  . PNA vac Low Risk Adult (2 of 2 - PPSV23) 04/12/2016  . INFLUENZA VACCINE  06/22/2017  . MAMMOGRAM  01/11/2019  . TETANUS/TDAP  11/22/2021  . DEXA SCAN  Completed  . Hepatitis C Screening  Completed   cologard negative and normal All answers were reviewed with the patient and necessary referrals were made:  Ivar Drape, PA   05/30/2017   History reviewed: During the course of the visit the patient was educated and counseled about appropriate screening and preventive services including:   Diet review for nutrition referral? Yes ____  Not Indicated ____   Patient Instructions (the written plan) was given to the patient.  Medicare Attestation I have personally reviewed: The patient's medical and social history Their use of alcohol, tobacco or illicit drugs Their current medications and supplements The patient's functional ability including ADLs,fall  risks, home safety risks, cognitive, and hearing and visual impairment Diet and physical activities Evidence for depression or mood disorders  The patient's weight, height, BMI, and visual acuity have been recorded in the chart.  I have made referrals, counseling, and provided education to the patient based on review of the above and I have provided the patient with a written personalized care plan for preventive services.     [place], [place]   05/30/2017

## 2017-05-30 NOTE — Patient Instructions (Signed)
pc    IF you received an x-ray today, you will receive an invoice from [facility]. Please contact [facility] at [phone] with questions or concerns regarding your invoice.   IF you received labwork today, you will receive an invoice from [place]. Please contact [facility] at [phone] with questions or concerns regarding your invoice.   Our billing staff will not be able to assist you with questions regarding bills from these companies.  You will be contacted with the lab results as soon as they are available. The fastest way to get your results is to activate your My Chart account. Instructions are located on the last page of this paperwork. If you have not heard from [place] regarding the results in 2 weeks, please contact this office.

## 2017-05-31 ENCOUNTER — Other Ambulatory Visit: Payer: Self-pay | Admitting: Family Medicine

## 2017-09-01 ENCOUNTER — Other Ambulatory Visit: Payer: Self-pay | Admitting: Family Medicine

## 2017-09-15 DIAGNOSIS — H2513 Age-related nuclear cataract, bilateral: Secondary | ICD-10-CM | POA: Diagnosis not present

## 2017-10-25 ENCOUNTER — Ambulatory Visit (INDEPENDENT_AMBULATORY_CARE_PROVIDER_SITE_OTHER): Payer: Medicare Other | Admitting: Physician Assistant

## 2017-10-25 ENCOUNTER — Other Ambulatory Visit: Payer: Self-pay

## 2017-10-25 ENCOUNTER — Encounter: Payer: Self-pay | Admitting: Physician Assistant

## 2017-10-25 ENCOUNTER — Ambulatory Visit (INDEPENDENT_AMBULATORY_CARE_PROVIDER_SITE_OTHER): Payer: Medicare Other

## 2017-10-25 VITALS — BP 130/82 | HR 83 | Temp 98.0°F | Resp 16 | Ht 63.78 in | Wt 155.0 lb

## 2017-10-25 DIAGNOSIS — R109 Unspecified abdominal pain: Secondary | ICD-10-CM

## 2017-10-25 DIAGNOSIS — M545 Low back pain, unspecified: Secondary | ICD-10-CM

## 2017-10-25 LAB — POCT URINALYSIS DIP (MANUAL ENTRY)
Bilirubin, UA: NEGATIVE
Glucose, UA: NEGATIVE mg/dL
Ketones, POC UA: NEGATIVE mg/dL
Nitrite, UA: NEGATIVE
Protein Ur, POC: NEGATIVE mg/dL
Spec Grav, UA: <=1.005 — AB (ref 1.010–1.025)
Urobilinogen, UA: 0.2 E.U./dL
pH, UA: 7 (ref 5.0–8.0)

## 2017-10-25 LAB — POC MICROSCOPIC URINALYSIS (UMFC): Mucus: ABSENT

## 2017-10-25 MED ORDER — CYCLOBENZAPRINE HCL 5 MG PO TABS: 5.0000 mg | ORAL_TABLET | Freq: Three times a day (TID) | ORAL | 0 refills | Status: DC | PRN

## 2017-10-25 NOTE — Progress Notes (Signed)
Patient ID: Maria Bautista, female     DOB: [birth year], 66 y.o.    MRN: [id number]  PCP: Joretta Bachelor, PA  Chief Complaint  Patient presents with  . Back Pain    lower back pain since Friday, pt states she hit her back on a rail on the bed. More on the right side    Subjective:   This patient presents for evaluation of RIGHT-sided LBP radiating into the low pelvis and groin. She is accompanied by her granddaughter.  Remote history of back surgery.  Years ago, she fell backward into the knob of a fire door. For about a week, she had bad pain and bruising of the RIGHT low back.  Friday 10/21/2017 she hit the same spot on the corner of an iron bed frame. Iniitally she had terrible pain with nausea. Rested and had improvement over the next couple of days. Yesterday the pain seemed to shift lower. This morning when she awoke and rolled to get up, she developed pain that radiates from the low back into the RIGHT groin.  No pain radiating down the leg. No lower extremity paresthesias or weakness. No saddle anesthesia.  No urinary changes. She does have some incomplete emptying of urination, "Because I'm an old lady, and I have old pipes." Normal BMs for her. No constipation. No nausea, vomiting, diarrhea. No fever, chills.  S/p appendectomy  Has taken 2 doses of cyclobenzaprine she had leftover from something else. Has taken 3 doses of naproxen. Applied ice compresses. Home treatments have helped.    Review of Systems As above.  Prior to Admission medications   Medication Sig Start Date End Date Taking? Authorizing Provider  Ascorbic Acid (VITAMIN C) 100 MG tablet Take 100 mg by mouth daily.   Yes [provider]  aspirin 81 MG tablet Take 81 mg by mouth daily.   Yes [provider]  calcium carbonate (OS-CAL) 600 MG TABS tablet Take 600 mg by mouth 2 (two) times daily with a meal.   Yes [provider]  cetirizine (ZYRTEC) 10 MG  tablet TAKE 1 TABLET BY MOUTH EVERY DAY 09/02/17  Yes Ivar Drape D, PA  cholecalciferol (VITAMIN D) 1000 UNITS tablet Take 1,000 Units by mouth daily.   Yes [provider]  cyclobenzaprine (FLEXERIL) 5 MG tablet Take 1-2 tablets (5-10 mg total) by mouth 3 (three) times daily as needed for muscle spasms. 01/10/16  Yes English, Colletta Maryland D, PA  ipratropium (ATROVENT) 0.03 % nasal spray Place 2 sprays into both nostrils 2 (two) times daily. 11/25/16  Yes English, Colletta Maryland D, PA  omeprazole (PRILOSEC) 10 MG capsule Take 10 mg by mouth daily.   Yes [provider]  spironolactone (ALDACTONE) 100 MG tablet Take 1 tablet (100 mg total) by mouth daily. 12/20/16  Yes Ivar Drape D, PA     Allergies  Allergen Reactions  . Codeine      Patient Active Problem List   Diagnosis Date Noted  . Osteoarthritis 08/28/2015  . Thinning hair 02/17/2015  . Borderline hyperglycemia 02/17/2015     Family History  Problem Relation Age of Onset  . Heart disease Mother   . Hyperlipidemia Mother   . Diabetes Father      Social History   Socioeconomic History  . Marital status: Divorced    Spouse name: Not on file  . Number of children: 2  . Years of education: Not on file  . Highest education level: Not on file  Social Needs  . Financial resource strain: Not on file  . Food insecurity - worry: Not on file  . Food insecurity - inability: Not on file  . Transportation needs - medical: Not on file  . Transportation needs - non-medical: Not on file  Occupational History  . Occupation: child care    Comment: [employer]  Tobacco Use  . Smoking status: Never Smoker  . Smokeless tobacco: Never Used  Substance and Sexual Activity  . Alcohol use: No    Alcohol/week: 0.0 oz  . Drug use: No  . Sexual activity: Yes    Birth control/protection: Surgical  Other Topics Concern  . Not on file  Social History Narrative   Lives alone.    Divorced 12/2016 from second husband following 30 years of marriage.   Daughters are adults and live independently.         Objective:  Physical Exam  Constitutional: She is oriented to person, place, and time. She appears well-developed and well-nourished. She is active and cooperative. No distress.  BP 130/82   Pulse 83   Temp 98 F (36.7 C) (Oral)   Resp 16   Ht 5' 3.78" (1.62 m)   Wt 155 lb (70.3 kg)   SpO2 98%   BMI 26.79 kg/m   HENT:  Head: Normocephalic and atraumatic.  Right Ear: Hearing normal.  Left Ear: Hearing normal.  Eyes: Conjunctivae are normal. No scleral icterus.  Neck: Normal range of motion. Neck supple. No thyromegaly present.  Cardiovascular: Normal rate, regular rhythm and normal heart sounds.  Pulses:      Radial pulses are 2+ on the right side, and 2+ on the left side.  Pulmonary/Chest: Effort normal and breath sounds normal.  Abdominal: Soft. Normal appearance and bowel sounds are normal. She exhibits no mass. There is no hepatosplenomegaly. There is tenderness in the right lower quadrant. There is no rigidity, no rebound, no guarding, no CVA tenderness, no tenderness at McBurney's point and negative Murphy's sign.    Lymphadenopathy:       Head (right side): No tonsillar, no preauricular, no posterior auricular and no occipital adenopathy present.       Head (left side): No tonsillar, no preauricular, no posterior auricular and no occipital adenopathy present.    She has no cervical adenopathy.       Right: No supraclavicular adenopathy present.       Left: No supraclavicular adenopathy present.  Neurological: She is alert and oriented to person, place, and time. No sensory deficit.  Skin: Skin is warm, dry and intact. No rash noted. No cyanosis or erythema. Nails show no clubbing.  Psychiatric: She has a normal mood and affect. Her speech is normal and behavior is normal.    Results for orders placed or performed in visit on 10/25/17  POCT  urinalysis dipstick  Result Value Ref Range   Color, UA yellow yellow   Clarity, UA clear clear   Glucose, UA negative negative mg/dL   Bilirubin, UA negative negative   Ketones, POC UA negative negative mg/dL   Spec Grav, UA <=1.005 (A) 1.010 - 1.025   Blood, UA trace-lysed (A) negative   pH, UA 7.0 5.0 - 8.0   Protein Ur, POC negative negative mg/dL   Urobilinogen, UA 0.2 0.2 or 1.0 E.U./dL   Nitrite, UA Negative Negative   Leukocytes, UA Trace (A) Negative  POCT Microscopic Urinalysis ([facility])  Result Value Ref Range   WBC,UR,HPF,POC Few (A)  None WBC/hpf   RBC,UR,HPF,POC None None RBC/hpf   Bacteria Few (A) None, Too numerous to count   Mucus Absent Absent   Epithelial Cells, UR Per Microscopy Moderate (A) None, Too numerous to count cells/hpf        Assessment & Plan:  1. Acute right-sided low back pain without sciatica Suspect MSK etiology, but radiation to groin and flank pain is atypical. Await imaging. - cyclobenzaprine (FLEXERIL) 5 MG tablet; Take 1-2 tablets (5-10 mg total) by mouth 3 (three) times daily as needed for muscle spasms.  Dispense: 30 tablet; Refill: 0  2. Acute right flank pain Unclear etiology, though MSK most likely. Await imaging. If symptoms worsen or persist, re-evaluate. Would likely need additional imaging. - POCT urinalysis dipstick - POCT Microscopic Urinalysis ([facility]) - DG Abd 1 View; Future    Return if symptoms worsen or fail to improve.   Fara Chute, PA-C [facility] [facility]

## 2017-10-25 NOTE — Patient Instructions (Addendum)
Continue the Aleve and the cyclobenzaprine (I've sent a new prescription to the pharmacy), as well as the application of cold packs. Try to avoid the movements that aggravate your pain, but do not stay too sedentary.    IF you received an x-ray today, you will receive an invoice from [facility]. Please contact [facility] at [phone] with questions or concerns regarding your invoice.   IF you received labwork today, you will receive an invoice from [place]. Please contact [facility] at [phone] with questions or concerns regarding your invoice.   Our billing staff will not be able to assist you with questions regarding bills from these companies.  You will be contacted with the lab results as soon as they are available. The fastest way to get your results is to activate your My Chart account. Instructions are located on the last page of this paperwork. If you have not heard from [place] regarding the results in 2 weeks, please contact this office.

## 2017-10-26 ENCOUNTER — Ambulatory Visit: Payer: Medicare Other | Admitting: Physician Assistant

## 2017-10-26 ENCOUNTER — Encounter: Payer: Self-pay | Admitting: Physician Assistant

## 2017-10-26 ENCOUNTER — Telehealth: Payer: Self-pay

## 2017-10-26 NOTE — Telephone Encounter (Signed)
Please advise.   Copied from [place] [phone]. Topic: Inquiry >> Oct 26, 2017  1:51 PM Oliver Pila B wrote: Reason for CRM: pt called to talk about the kidney stone, the pain is much better and seeing if she can wait on ct scan and be able to come in Friday for x-ray instead if Dr. Daphane Shepherd thinks its okay

## 2017-10-26 NOTE — Telephone Encounter (Signed)
As long as she is feeling better, it's absolutely OK to come back in on Friday.

## 2017-11-07 ENCOUNTER — Encounter (HOSPITAL_COMMUNITY): Payer: Self-pay

## 2017-12-05 ENCOUNTER — Other Ambulatory Visit: Payer: Self-pay | Admitting: Family Medicine

## 2017-12-05 DIAGNOSIS — L659 Nonscarring hair loss, unspecified: Secondary | ICD-10-CM

## 2017-12-05 NOTE — Telephone Encounter (Signed)
Disregard previous message; 30 day refill not given

## 2017-12-05 NOTE — Telephone Encounter (Signed)
Last physical 11/23/16;office visit needed; given 30 day refill granted

## 2017-12-12 ENCOUNTER — Other Ambulatory Visit: Payer: Self-pay | Admitting: Physician Assistant

## 2017-12-12 DIAGNOSIS — Z1231 Encounter for screening mammogram for malignant neoplasm of breast: Secondary | ICD-10-CM

## 2017-12-19 ENCOUNTER — Ambulatory Visit (INDEPENDENT_AMBULATORY_CARE_PROVIDER_SITE_OTHER): Payer: Medicare Other | Admitting: Physician Assistant

## 2017-12-19 ENCOUNTER — Other Ambulatory Visit: Payer: Self-pay

## 2017-12-19 VITALS — BP 124/70 | HR 92 | Temp 98.3°F | Resp 16 | Ht 63.0 in | Wt 150.0 lb

## 2017-12-19 DIAGNOSIS — J0140 Acute pansinusitis, unspecified: Secondary | ICD-10-CM | POA: Diagnosis not present

## 2017-12-19 MED ORDER — IPRATROPIUM BROMIDE 0.03 % NA SOLN: 2.0000 | Freq: Two times a day (BID) | NASAL | 0 refills | Status: DC

## 2017-12-19 MED ORDER — AMOXICILLIN-POT CLAVULANATE 875-125 MG PO TABS: 1.0000 | ORAL_TABLET | Freq: Two times a day (BID) | ORAL | 0 refills | Status: AC

## 2017-12-19 NOTE — Progress Notes (Signed)
PRIMARY CARE AT [facility] [address] [place] [address] [phone]  Date:  12/19/2017   Name:  Maria Bautista   DOB:  May 06, 1951   MRN:  [id number]  PCP:  Joretta Bachelor, PA    History of Present Illness:  Maria Bautista is a 67 y.o. female patient who presents to PCP with  Chief Complaint  Patient presents with  . Sinusitis    x 1 wk/eyes are crusted when pt wakes up in the morning, sinus pressure headache     Patient is here today for sinus symptoms for 8 days.  She has congestion.  With 4 days of of post nasal sinus drainage.  Left eye with swelling and discharge several days ago.  This has resolved.  She has no fever.  No chest tightness.  No sob or dypsnea.  She now has nasal mucus that she notes has progressed from clear fluid to colored mucus, and she has now has nasal bleeding.  No ear discomfort.  Pain of her frontal sinuses which she points out, and feels altogether worse in the last 24 hours. 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  Patient Active Problem List   Diagnosis Date Noted  . Osteoarthritis 08/28/2015  . Thinning hair 02/17/2015  . Borderline hyperglycemia 02/17/2015    Past Medical History:  Diagnosis Date  . Allergy   . Arthritis     Past Surgical History:  Procedure Laterality Date  . ABDOMINAL HYSTERECTOMY    . APPENDECTOMY    . BREAST EXCISIONAL BIOPSY Left   . CESAREAN SECTION    . FRACTURE SURGERY    . SPINE SURGERY      Social History   Tobacco Use  . Smoking status: Never Smoker  . Smokeless tobacco: Never Used  Substance Use Topics  . Alcohol use: No    Alcohol/week: 0.0 oz  . Drug use: No    Family History  Problem Relation Age of Onset  . Heart disease Mother   . Hyperlipidemia Mother   . Diabetes Father     Allergies  Allergen Reactions  . Codeine     Medication list has been reviewed and updated.  Current Outpatient Medications on File Prior  to Visit  Medication Sig Dispense Refill  . Ascorbic Acid (VITAMIN C) 100 MG tablet Take 100 mg by mouth daily.    Marland Kitchen aspirin 81 MG tablet Take 81 mg by mouth daily.    . calcium carbonate (OS-CAL) 600 MG TABS tablet Take 600 mg by mouth 2 (two) times daily with a meal.    . cetirizine (ZYRTEC) 10 MG tablet TAKE 1 TABLET BY MOUTH EVERY DAY 90 tablet 0  . cholecalciferol (VITAMIN D) 1000 UNITS tablet Take 1,000 Units by mouth daily.    . Cranberry 500 MG CAPS Take by mouth.    . cyclobenzaprine (FLEXERIL) 5 MG tablet Take 1-2 tablets (5-10 mg total) by mouth 3 (three) times daily as needed for muscle spasms. 30 tablet 0  . ipratropium (ATROVENT) 0.03 % nasal spray Place 2 sprays into both nostrils 2 (two) times daily. 30 mL 0  . omeprazole (PRILOSEC) 10 MG capsule Take 10 mg by mouth daily.    Marland Kitchen spironolactone (ALDACTONE) 100 MG tablet Take 1 tablet (100 mg total) by mouth daily. 90 tablet 3  . spironolactone (ALDACTONE) 100 MG tablet Take 1 tablet (100 mg total) by mouth daily. No more refills without office visit 90  tablet 0   No current facility-administered medications on file prior to visit.     ROS ROS otherwise unremarkable unless listed above.  Physical Examination: BP 124/70   Pulse 92   Temp 98.3 F (36.8 C) (Oral)   Resp 16   Ht 5\' 3"  (1.6 m)   Wt 150 lb (68 kg)   SpO2 99%   BMI 26.57 kg/m  Ideal Body Weight: Weight in (lb) to have BMI = 25: 140.8  Physical Exam  Constitutional: She is oriented to person, place, and time. She appears well-developed and well-nourished. No distress.  HENT:  Head: Normocephalic and atraumatic.  Right Ear: Tympanic membrane, external ear and ear canal normal.  Left Ear: Tympanic membrane, external ear and ear canal normal.  Nose: Mucosal edema and rhinorrhea present. Right sinus exhibits frontal sinus tenderness. Right sinus exhibits no maxillary sinus tenderness. Left sinus exhibits frontal sinus tenderness. Left sinus exhibits no  maxillary sinus tenderness.  Mouth/Throat: No uvula swelling. No oropharyngeal exudate, posterior oropharyngeal edema or posterior oropharyngeal erythema.  Eyes: Conjunctivae and EOM are normal. Pupils are equal, round, and reactive to light.  Cardiovascular: Normal rate and regular rhythm. Exam reveals no gallop, no distant heart sounds and no friction rub.  No murmur heard. Pulmonary/Chest: Effort normal and breath sounds normal. No respiratory distress. She has no decreased breath sounds. She has no wheezes. She has no rhonchi.  Lymphadenopathy:       Head (right side): No submandibular, no tonsillar, no preauricular and no posterior auricular adenopathy present.       Head (left side): No submandibular, no tonsillar, no preauricular and no posterior auricular adenopathy present.  Neurological: She is alert and oriented to person, place, and time.  Skin: She is not diaphoretic.  Psychiatric: She has a normal mood and affect. Her behavior is normal.     Assessment and Plan: Maria Bautista is a 67 y.o. female who is here today for cc of  Chief Complaint  Patient presents with  . Sinusitis    x 1 wk/eyes are crusted when pt wakes up in the morning, sinus pressure headache  treating with augmentin and nasal spray Rtc as needed.  Acute non-recurrent pansinusitis - Plan: ipratropium (ATROVENT) 0.03 % nasal spray, amoxicillin-clavulanate (AUGMENTIN) 875-125 MG tablet  Ivar Drape, PA-C [facility] 1/28/201912:35 PM

## 2017-12-19 NOTE — Patient Instructions (Addendum)
Hydrating well with 64 oz of water if not more. You can try tylenol or ibuprofen for pain and fever.  Sinusitis, Adult Sinusitis is soreness and inflammation of your sinuses. Sinuses are hollow spaces in the bones around your face. They are located:  Around your eyes.  In the middle of your forehead.  Behind your nose.  In your cheekbones.  Your sinuses and nasal passages are lined with a stringy fluid (mucus). Mucus normally drains out of your sinuses. When your nasal tissues get inflamed or swollen, the mucus can get trapped or blocked so air cannot flow through your sinuses. This lets bacteria, viruses, and funguses grow, and that leads to infection. Follow these instructions at home: Medicines  Take, use, or apply over-the-counter and prescription medicines only as told by your doctor. These may include nasal sprays.  If you were prescribed an antibiotic medicine, take it as told by your doctor. Do not stop taking the antibiotic even if you start to feel better. Hydrate and Humidify  Drink enough water to keep your pee (urine) clear or pale yellow.  Use a cool mist humidifier to keep the humidity level in your home above 50%.  Breathe in steam for 10-15 minutes, 3-4 times a day or as told by your doctor. You can do this in the bathroom while a hot shower is running.  Try not to spend time in cool or dry air. Rest  Rest as much as possible.  Sleep with your head raised (elevated).  Make sure to get enough sleep each night. General instructions  Put a warm, moist washcloth on your face 3-4 times a day or as told by your doctor. This will help with discomfort.  Wash your hands often with soap and water. If there is no soap and water, use hand sanitizer.  Do not smoke. Avoid being around people who are smoking (secondhand smoke).  Keep all follow-up visits as told by your doctor. This is important. Contact a doctor if:  You have a fever.  Your symptoms get  worse.  Your symptoms do not get better within 10 days. Get help right away if:  You have a very bad headache.  You cannot stop throwing up (vomiting).  You have pain or swelling around your face or eyes.  You have trouble seeing.  You feel confused.  Your neck is stiff.  You have trouble breathing. This information is not intended to replace advice given to you by your health care provider. Make sure you discuss any questions you have with your health care provider. Document Released: 04/26/2008 Document Revised: 07/04/2016 Document Reviewed: 09/03/2015 Elsevier Interactive Patient Education  2018 [employer].    IF you received an x-ray today, you will receive an invoice from [facility]. Please contact [facility] at [phone] with questions or concerns regarding your invoice.   IF you received labwork today, you will receive an invoice from [facility]. Please contact [facility] at [phone] with questions or concerns regarding your invoice.   Our billing staff will not be able to assist you with questions regarding bills from these companies.  You will be contacted with the lab results as soon as they are available. The fastest way to get your results is to activate your My Chart account. Instructions are located on the last page of this paperwork. If you have not heard from [place] regarding the results in 2 weeks, please contact this office.

## 2017-12-20 ENCOUNTER — Encounter: Payer: Self-pay | Admitting: Physician Assistant

## 2018-01-13 ENCOUNTER — Ambulatory Visit
Admission: RE | Admit: 2018-01-13 | Discharge: 2018-01-13 | Disposition: A | Discharge status: Home / Self Care | Payer: Medicare Other | Source: Ambulatory Visit | Attending: Physician Assistant | Admitting: Physician Assistant

## 2018-01-13 DIAGNOSIS — Z1231 Encounter for screening mammogram for malignant neoplasm of breast: Secondary | ICD-10-CM | POA: Diagnosis not present

## 2018-01-18 ENCOUNTER — Ambulatory Visit: Payer: Medicare Other

## 2018-01-18 ENCOUNTER — Other Ambulatory Visit: Payer: Self-pay | Admitting: Physician Assistant

## 2018-01-18 DIAGNOSIS — I10 Essential (primary) hypertension: Secondary | ICD-10-CM

## 2018-01-18 LAB — CMP14+EGFR
ALT: 28 IU/L (ref 0–32)
AST: 30 IU/L (ref 0–40)
Albumin/Globulin Ratio: 2.2 (ref 1.2–2.2)
Albumin: 4.8 g/dL (ref 3.6–4.8)
Alkaline Phosphatase: 78 IU/L (ref 39–117)
BUN/Creatinine Ratio: 12 (ref 12–28)
BUN: 9 mg/dL (ref 8–27)
Bilirubin Total: 0.8 mg/dL (ref 0.0–1.2)
CO2: 24 mmol/L (ref 20–29)
Calcium: 10.6 mg/dL — ABNORMAL HIGH (ref 8.7–10.3)
Chloride: 103 mmol/L (ref 96–106)
Creatinine, Ser: 0.77 mg/dL (ref 0.57–1.00)
GFR calc Af Amer: 92 mL/min/{1.73_m2} (ref >59)
GFR calc non Af Amer: 80 mL/min/{1.73_m2} (ref >59)
Globulin, Total: 2.2 g/dL (ref 1.5–4.5)
Glucose: 105 mg/dL — ABNORMAL HIGH (ref 65–99)
Potassium: 5.1 mmol/L (ref 3.5–5.2)
Sodium: 141 mmol/L (ref 134–144)
Total Protein: 7 g/dL (ref 6.0–8.5)

## 2018-01-24 ENCOUNTER — Other Ambulatory Visit: Payer: Self-pay | Admitting: Physician Assistant

## 2018-01-24 DIAGNOSIS — L659 Nonscarring hair loss, unspecified: Secondary | ICD-10-CM

## 2018-01-24 MED ORDER — SPIRONOLACTONE 100 MG PO TABS: 100.0000 mg | ORAL_TABLET | Freq: Every day | ORAL | 1 refills | Status: DC

## 2018-03-01 ENCOUNTER — Encounter: Payer: Self-pay | Admitting: Physician Assistant

## 2018-03-10 ENCOUNTER — Other Ambulatory Visit: Payer: Self-pay | Admitting: Family Medicine

## 2018-04-24 ENCOUNTER — Other Ambulatory Visit: Payer: Self-pay | Admitting: Family Medicine

## 2018-04-24 DIAGNOSIS — L659 Nonscarring hair loss, unspecified: Secondary | ICD-10-CM

## 2018-04-28 ENCOUNTER — Ambulatory Visit (INDEPENDENT_AMBULATORY_CARE_PROVIDER_SITE_OTHER): Payer: Medicare Other | Admitting: Physician Assistant

## 2018-04-28 ENCOUNTER — Other Ambulatory Visit: Payer: Self-pay

## 2018-04-28 ENCOUNTER — Encounter: Payer: Self-pay | Admitting: Physician Assistant

## 2018-04-28 DIAGNOSIS — L659 Nonscarring hair loss, unspecified: Secondary | ICD-10-CM | POA: Diagnosis not present

## 2018-04-28 MED ORDER — SPIRONOLACTONE 100 MG PO TABS: 100.0000 mg | ORAL_TABLET | Freq: Every day | ORAL | 2 refills | Status: DC

## 2018-04-28 NOTE — Patient Instructions (Signed)
     IF you received an x-ray today, you will receive an invoice from [facility]. Please contact [facility] at [phone] with questions or concerns regarding your invoice.   IF you received labwork today, you will receive an invoice from [facility]. Please contact [facility] at [phone] with questions or concerns regarding your invoice.   Our billing staff will not be able to assist you with questions regarding bills from these companies.  You will be contacted with the lab results as soon as they are available. The fastest way to get your results is to activate your My Chart account. Instructions are located on the last page of this paperwork. If you have not heard from us regarding the results in 2 weeks, please contact this office.     

## 2018-04-28 NOTE — Progress Notes (Signed)
   Maria Bautista  MRN: [id number] DOB: 1951-05-20  PCP: Maria Bale, PA-C  Subjective:  Pt is a 67 year old female who presents to clinic for medication refill of Spironolactone Aldactone 100 mg - she takes this for hair loss.  She is a former patient of Maria Bautista.  This is my first time seeing this patient. Denies medication side effects.  Doing well on this dose.  She is feeling improvement with this treatment.  Review of Systems  Gastrointestinal: Negative for nausea and vomiting.  Skin: Negative.     Patient Active Problem List   Diagnosis Date Noted  . Osteoarthritis 08/28/2015  . Thinning hair 02/17/2015  . Borderline hyperglycemia 02/17/2015    Current Outpatient Medications on File Prior to Visit  Medication Sig Dispense Refill  . Ascorbic Acid (VITAMIN C) 100 MG tablet Take 100 mg by mouth daily.    Marland Kitchen aspirin 81 MG tablet Take 81 mg by mouth daily.    . calcium carbonate (OS-CAL) 600 MG TABS tablet Take 600 mg by mouth 2 (two) times daily with a meal.    . cetirizine (ZYRTEC) 10 MG tablet TAKE 1 TABLET BY MOUTH EVERY DAY 90 tablet 0  . cholecalciferol (VITAMIN D) 1000 UNITS tablet Take 1,000 Units by mouth daily.    . Cranberry 500 MG CAPS Take by mouth.    . cyclobenzaprine (FLEXERIL) 5 MG tablet Take 1-2 tablets (5-10 mg total) by mouth 3 (three) times daily as needed for muscle spasms. 30 tablet 0  . ipratropium (ATROVENT) 0.03 % nasal spray Place 2 sprays into both nostrils 2 (two) times daily. 30 mL 0  . omeprazole (PRILOSEC) 10 MG capsule Take 10 mg by mouth daily.    Marland Kitchen spironolactone (ALDACTONE) 100 MG tablet Take 1 tablet (100 mg total) by mouth daily. No more refills without office visit 90 tablet 0  . spironolactone (ALDACTONE) 100 MG tablet Take 1 tablet (100 mg total) by mouth daily. 90 tablet 1   No current facility-administered medications on file prior to visit.     Allergies  Allergen Reactions  . Codeine      Objective:  BP 118/70 (BP  Location: Left Arm, Patient Position: Sitting)   Pulse 65   Temp 97.6 F (36.4 C) (Oral)   Resp 16   Ht 5' 2.5" (1.588 m)   Wt 150 lb (68 kg)   SpO2 96%   BMI 27.00 kg/m   Physical Exam  Constitutional: She is oriented to person, place, and time. No distress.  HENT:  Head: Hair is abnormal (Thin, mostly on the top of her head).  Neurological: She is alert and oriented to person, place, and time.  Skin: Skin is warm and dry.  Psychiatric: Judgment normal.  Vitals reviewed.   Assessment and Plan :  1. Hair loss - spironolactone (ALDACTONE) 100 MG tablet; Take 1 tablet (100 mg total) by mouth daily.  Dispense: 90 tablet; Refill: 2 -Patient presents for medication refill of spironolactone.  She takes this for hair loss.  Denies medication side effects.  RTC in 6 months for annual exam. Consider endocrinology referral   Mercer Pod, PA-C  [facility] 04/28/2018 10:21 AM

## 2018-10-10 ENCOUNTER — Encounter: Payer: Self-pay | Admitting: Physician Assistant

## 2018-10-10 ENCOUNTER — Other Ambulatory Visit: Payer: Self-pay

## 2018-10-10 ENCOUNTER — Ambulatory Visit (INDEPENDENT_AMBULATORY_CARE_PROVIDER_SITE_OTHER): Payer: Medicare Other | Admitting: Physician Assistant

## 2018-10-10 VITALS — BP 137/81 | HR 83 | Temp 98.6°F | Resp 16 | Ht 62.0 in | Wt 150.0 lb

## 2018-10-10 DIAGNOSIS — J019 Acute sinusitis, unspecified: Secondary | ICD-10-CM

## 2018-10-10 DIAGNOSIS — B9789 Other viral agents as the cause of diseases classified elsewhere: Secondary | ICD-10-CM

## 2018-10-10 MED ORDER — PREDNISONE 20 MG PO TABS: ORAL_TABLET | ORAL | 0 refills | Status: DC

## 2018-10-10 NOTE — Progress Notes (Signed)
Maria Bautista  MRN: [id number] DOB: 1951/04/04  PCP: Mancel Bale, PA-C  Subjective:  Pt is a 67 year old female who presents to clinic for HA x 1 week. Loud noises make it worse.  Endorses sinus pressure and "pounding" HA with voice gone three days ago. Nose stops up pretty bad at night. symptoms are improving as of today.  She has been taking Emergen-C, Benadryl allergy.  Denies fever, chills, fatigue, n/v, body aches.   Pt  has a past medical history of Allergy and Arthritis.  Review of Systems  Constitutional: Negative for chills, diaphoresis, fatigue and fever.  HENT: Positive for congestion and sinus pressure. Negative for postnasal drip, rhinorrhea, sinus pain and sore throat.   Respiratory: Negative for cough, chest tightness, shortness of breath and wheezing.   Neurological: Positive for headaches.  Psychiatric/Behavioral: Negative for sleep disturbance.    Patient Active Problem List   Diagnosis Date Noted  . Osteoarthritis 08/28/2015  . Thinning hair 02/17/2015  . Borderline hyperglycemia 02/17/2015    Current Outpatient Medications on File Prior to Visit  Medication Sig Dispense Refill  . Ascorbic Acid (VITAMIN C) 100 MG tablet Take 100 mg by mouth daily.    Marland Kitchen aspirin 81 MG tablet Take 81 mg by mouth daily.    . calcium carbonate (OS-CAL) 600 MG TABS tablet Take 600 mg by mouth 2 (two) times daily with a meal.    . cetirizine (ZYRTEC) 10 MG tablet TAKE 1 TABLET BY MOUTH EVERY DAY 90 tablet 0  . cholecalciferol (VITAMIN D) 1000 UNITS tablet Take 1,000 Units by mouth daily.    . Cranberry 500 MG CAPS Take by mouth.    . cyclobenzaprine (FLEXERIL) 5 MG tablet Take 1-2 tablets (5-10 mg total) by mouth 3 (three) times daily as needed for muscle spasms. 30 tablet 0  . ipratropium (ATROVENT) 0.03 % nasal spray Place 2 sprays into both nostrils 2 (two) times daily. 30 mL 0  . omeprazole (PRILOSEC) 10 MG capsule Take 10 mg by mouth daily.    Marland Kitchen spironolactone  (ALDACTONE) 100 MG tablet Take 1 tablet (100 mg total) by mouth daily. 90 tablet 1  . spironolactone (ALDACTONE) 100 MG tablet Take 1 tablet (100 mg total) by mouth daily. 90 tablet 2   No current facility-administered medications on file prior to visit.     Allergies  Allergen Reactions  . Codeine      Objective:  BP 137/81 (BP Location: Left Arm, Patient Position: Sitting, Cuff Size: Normal)   Pulse 83   Temp 98.6 F (37 C) (Oral)   Resp 16   Ht 5\' 2"  (1.575 m)   Wt 150 lb (68 kg)   SpO2 96%   BMI 27.44 kg/m   Physical Exam  Constitutional: She is oriented to person, place, and time. No distress.  HENT:  Right Ear: Tympanic membrane normal.  Left Ear: Tympanic membrane normal.  Nose: Mucosal edema present. No rhinorrhea. Right sinus exhibits no maxillary sinus tenderness and no frontal sinus tenderness. Left sinus exhibits no maxillary sinus tenderness and no frontal sinus tenderness.  Mouth/Throat: Oropharynx is clear and moist and mucous membranes are normal.  Cardiovascular: Normal rate, regular rhythm and normal heart sounds.  Pulmonary/Chest: Effort normal and breath sounds normal. No respiratory distress. She has no wheezes. She has no rales.  Neurological: She is alert and oriented to person, place, and time.  Skin: Skin is warm and dry.  Psychiatric: Judgment normal.  Vitals reviewed.  Assessment and Plan :  1. Acute viral sinusitis - pt presents with symptoms consistent with acute viral sinusitis. Plan to treat with prednisone and supportive care. RTC in 7-10 days if no improvement.  - predniSONE (DELTASONE) 20 MG tablet; 40mg  x 4 days, then 20mg  x 4 days  Dispense: 14 tablet; Refill: 0   Whitney Doreather Hoxworth, PA-C  [facility] 10/10/2018 1:56 PM  Please note: Portions of this report may have been transcribed using dragon voice recognition software. Every effort was made to ensure accuracy; however, inadvertent computerized  transcription errors may be present.

## 2018-10-10 NOTE — Patient Instructions (Addendum)
It has been my absolute pleasure taking part in caring for your health.  I am moving out of town with my fiance to start a new journey.  Please come back to [place] [facility] as you see fit, as we have great providers who will take good care of you.  If you would prefer to follow one of our former lead PA's, Harrison Mons or Windell Hummingbird, you can find them at [address], [place] [address] [phone].    I am treating you today for viral sinusitis.  Consider using a Neti Pot (you can buy this at your pharmacy). If not, try the saline sinus rinse.  Start taking Prednisone: 40mg x4days, 20 mgx4days  Take Mucinex (plain) for the next 3-4 days. Drink plenty of water while taking this medication. Water will help thin out your mucus.  Flonase nasal spray - use this twice daily.   Come back if you are not better in 7-10 days.    ACUTE VIRAL RHINOSINUSITIS - Patients with acute viral rhinosinusitis (AVRS) should be managed with supportive care. There are no treatments to shorten the clinical course of the disease.  Natural history - AVRS may not completely resolve within 10 days but is expected to improve. Patients who fail to improve after ?10 days of symptomatic management are more likely to have acute bacterial rhinosinusitis. Symptomatic therapies - Symptomatic management of acute rhinosinusitis (ARS) aims to relieve symptoms of nasal obstruction and runny nose as well as the systemic signs and symptoms such as fever and fatigue. When needed, we suggest over-the-counter (OTC) analgesics and antipyretics, saline irrigation, and intranasal glucocorticoids for symptomatic management in patients with ARS. Analgesics and antipyretics - OTC analgesics and antipyretics such as nonsteroidal anti-inflammatory drugs and acetaminophen can be used for pain and fever relief as needed. Saline irrigation - Mechanical irrigation with saline may reduce the  need for pain medication and improve overall patient comfort, particularly in patients with frequent sinus infections. It is important that irrigants be prepared from sterile or bottled water. (See below for instructions)  Intranasal glucocorticoids -  Intranasal glucocorticoids are likely to be most beneficial for patients with underlying allergies. This allows improved sinus drainage. A higher dose of intranasal glucocorticoids had a stronger effect on symptom improvement. -Other- ?Oral decongestants - Oral decongestants may be useful when eustachian tube dysfunction is a factor for patients with AVRS. These patients may benefit from a short course (three to five days) of oral decongestants. Oral decongestants should be used with caution in patients with cardiovascular disease, hypertension, angle-closure glaucoma, or bladder neck obstruction. ?Intranasal decongestants - Intranasal decongestants are often used as symptomatic therapies by patients. These agents, such as oxymetazoline, may provide a subjective sense of improved nasal patency. There is also concern that intranasal decongestants themselves may provoke mucosal inflammation. If used, topical decongestants should be used sparingly for no more than three consecutive days to avoid rebound congestion, addiction, and mucosal damage associated with long-term use. ?Antihistamines - Antihistamines are frequently used for symptom relief due to their drying effects; however, there are no studies investigating their efficacy for ARS. Over-drying of the mucosa may lead to further discomfort. Additionally, antihistamines are often associated with adverse effects.  ?Mucolytics - Mucolytics such as guaifenesin serve to thin secretions and may promote ease of mucus drainage and clearance.   SALINE NASAL IRRIGATION  The benefits  1. Saline (saltwater) washes the mucus and irritants from your nose.  2.  The sinus passages are moisturized.  3. Studies have  also shown that a nasal irrigation improves cell function (the cells that move the mucus work better).  The recipe  Use a one-quart glass jar that is thoroughly cleansed.  You may use a large medical syringe (30 cc), water pick with an irrigation tip (preferred method), squeeze bottle, or Neti pot. Do not use a baby bulb syringe. The syringe or pick should be sterilized frequently or replaced every two to three weeks to avoid contamination and infection.  Fill with water that has been distilled, previously boiled, or otherwise sterilized. Plain tap water is not recommended, because it is not necessarily sterile.  Add 1 to 1 heaping teaspoons of pickling/canning salt. Do not use table salt, because it contains a large number of additives.  Add 1 teaspoon of baking soda (pure bicarbonate).  Mix ingredients together, and store at room temperature. Discard after one week.  You may also make up a solution from premixed packets that are commercially prepared specifically for nasal irrigation.  The instructions  Irrigate your nose with saline one to two times per day.   If you have been told to use nasal medication, you should always use your saline solution first. The nasal medication is much more effective when sprayed onto clean nasal membranes, and the spray will reach deeper into the nose.   Pour the amount of fluid you plan to use into a clean bowl. Do not put your used syringe back into the storage container, because it contaminates your solution.   You may warm the solution slightly in the microwave, but be sure that the solution is not hot.   Bend over the sink (some people do this in the shower), and squirt the solution into each side of your nose, aiming the stream toward the back of your head, not the top of your head. The solution should flow into one nostril and out of the other, but it will not harm you if you swallow a little.   Some people experience a little burning sensation the  first few times that they use buffered saline solution, but this usually goes away after they adapt to it.

## 2018-11-30 ENCOUNTER — Other Ambulatory Visit: Payer: Self-pay | Admitting: Family Medicine

## 2018-11-30 DIAGNOSIS — Z1231 Encounter for screening mammogram for malignant neoplasm of breast: Secondary | ICD-10-CM

## 2018-12-25 ENCOUNTER — Other Ambulatory Visit: Payer: Self-pay | Admitting: Physician Assistant

## 2018-12-25 DIAGNOSIS — L659 Nonscarring hair loss, unspecified: Secondary | ICD-10-CM

## 2018-12-25 NOTE — Telephone Encounter (Signed)
Courtesy refill  

## 2019-01-22 ENCOUNTER — Ambulatory Visit (INDEPENDENT_AMBULATORY_CARE_PROVIDER_SITE_OTHER): Payer: Medicare Other | Admitting: Family Medicine

## 2019-01-22 ENCOUNTER — Encounter: Payer: Self-pay | Admitting: Family Medicine

## 2019-01-22 ENCOUNTER — Other Ambulatory Visit: Payer: Self-pay

## 2019-01-22 VITALS — BP 128/79 | HR 86 | Temp 98.6°F | Ht 62.0 in | Wt 151.4 lb

## 2019-01-22 DIAGNOSIS — M7050 Other bursitis of knee, unspecified knee: Secondary | ICD-10-CM | POA: Diagnosis not present

## 2019-01-22 MED ORDER — IBUPROFEN 600 MG PO TABS: 600.0000 mg | ORAL_TABLET | Freq: Three times a day (TID) | ORAL | 0 refills | Status: DC | PRN

## 2019-01-22 NOTE — Progress Notes (Signed)
3/2/20202:49 PM  Maria Bautista Oct 26, 1951, 68 y.o. female [id number]  Chief Complaint  Patient presents with  . Back Pain    left knee pain for the past month, taking aleve for the pain. Has some swelling and feels a knot there as well    HPI:   Patient is a 68 y.o. female  who presents today for left knee pain   Started about a month ago Active, works with preschooler, kneels Has been getting slowly worse Pain is worse along medial inferior aspect, burning Denies any swelling, redness or warmth Sometimes feels like it might give out, no popping or locking She has been icing and elevating Taking aleve but not helping as much  Fall Risk  01/22/2019 10/10/2018 04/28/2018 12/19/2017 10/25/2017  Falls in the past year? 0 1 No No No  Comment - tripped, didn't hurt myself - - -  Number falls in past yr: 0 - - - -  Injury with Fall? 0 - - - -     Depression screen [facility] 2/9 01/22/2019 10/10/2018 04/28/2018  Decreased Interest 0 0 0  Down, Depressed, Hopeless 0 0 0  PHQ - 2 Score 0 0 0    Allergies  Allergen Reactions  . Codeine     Prior to Admission medications   Medication Sig Start Date End Date Taking? Authorizing Provider  Ascorbic Acid (VITAMIN C) 100 MG tablet Take 100 mg by mouth daily.   Yes [provider]  aspirin 81 MG tablet Take 81 mg by mouth daily.   Yes [provider]  calcium carbonate (OS-CAL) 600 MG TABS tablet Take 600 mg by mouth 2 (two) times daily with a meal.   Yes [provider]  cetirizine (ZYRTEC) 10 MG tablet TAKE 1 TABLET BY MOUTH EVERY DAY 09/02/17  Yes Ivar Drape D, PA  cholecalciferol (VITAMIN D) 1000 UNITS tablet Take 1,000 Units by mouth daily.   Yes [provider]  Cranberry 500 MG CAPS Take by mouth.   Yes [provider]  cyclobenzaprine (FLEXERIL) 5 MG tablet Take 1-2 tablets (5-10 mg total) by mouth 3 (three) times daily as needed for muscle spasms. 10/25/17  Yes Jeffery, Chelle, PA    ipratropium (ATROVENT) 0.03 % nasal spray Place 2 sprays into both nostrils 2 (two) times daily. 12/19/17  Yes English, Colletta Maryland D, PA  omeprazole (PRILOSEC) 10 MG capsule Take 10 mg by mouth daily.   Yes [provider]  spironolactone (ALDACTONE) 100 MG tablet TAKE 1 TABLET BY MOUTH  DAILY 12/25/18  Yes Forrest Moron, MD    Past Medical History:  Diagnosis Date  . Allergy   . Arthritis     Past Surgical History:  Procedure Laterality Date  . ABDOMINAL HYSTERECTOMY    . APPENDECTOMY    . BREAST EXCISIONAL BIOPSY Left   . CESAREAN SECTION    . FRACTURE SURGERY    . SPINE SURGERY      Social History   Tobacco Use  . Smoking status: Never Smoker  . Smokeless tobacco: Never Used  Substance Use Topics  . Alcohol use: No    Alcohol/week: 0.0 standard drinks    Family History  Problem Relation Age of Onset  . Heart disease Mother   . Hyperlipidemia Mother   . Diabetes Father     ROS Per hpi  OBJECTIVE:  Blood pressure 128/79, pulse 86, temperature 98.6 F (37 C), temperature source Oral, height 5\' 2"  (1.575 m), weight 151 lb 6.4  oz (68.7 kg), SpO2 96 %. Body mass index is 27.69 kg/m.   Physical Exam  Gen: AAOx3, NAD Left Knee: mild swelling, no effusion, no redness or warmth. FROM, TTP over pes anserine bursa. Neg lachman, mcmurry, varus/valgus tests   ASSESSMENT and PLAN  1. Pes anserine bursitis Discussed supportive measures, new meds r/se/b and RTC precautions. Patient educational handout given. Other orders - ibuprofen (ADVIL,MOTRIN) 600 MG tablet; Take 1 tablet (600 mg total) by mouth every 8 (eight) hours as needed.  Return if symptoms worsen or fail to improve.    Rutherford Guys, MD [facility] [place], [place] [address] Ph.  [phone] Fax [phone]

## 2019-01-22 NOTE — Patient Instructions (Addendum)
If you have lab work done today you will be contacted with your lab results within the next 2 weeks.  If you have not heard from [place] then please contact us. The fastest way to get your results is to register for My Chart.   IF you received an x-ray today, you will receive an invoice from [facility]. Please contact [facility] at [phone] with questions or concerns regarding your invoice.   IF you received labwork today, you will receive an invoice from [facility] [place]. Please contact [facility] at [phone] with questions or concerns regarding your invoice.   Our billing staff will not be able to assist you with questions regarding bills from these companies.  You will be contacted with the lab results as soon as they are available. The fastest way to get your results is to activate your My Chart account. Instructions are located on the last page of this paperwork. If you have not heard from [place] regarding the results in 2 weeks, please contact this office.     Pes Anserine Bursitis Rehab Ask your health care provider which exercises are safe for you. Do exercises exactly as told by your health care provider and adjust them as directed. It is normal to feel mild stretching, pulling, tightness, or discomfort as you do these exercises, but you should stop right away if you feel sudden pain or your pain gets worse.Do not begin these exercises until told by your health care provider. Stretching and range of motion exercises These exercises warm up your muscles and joints and improve the movement and flexibility of your knee. These exercises also help to relieve pain and stiffness. Exercise A: Hamstring, doorway  1. Lie on your back in front of a doorway with your left / right leg resting against the wall and your other leg flat on the floor in the doorway. There should be a slight bend in your left / right knee. 2. Straighten your left / right knee. You should feel a stretch  behind your knee or thigh. If you do not, scoot your buttocks closer to the door. 3. Hold this position for __________ seconds. Repeat __________ times. Complete this stretch __________ times a day. Exercise B: V-sit (hamstrings and adductors)  1. Sit on the floor with your legs extended in a large "V" shape. Keep your knees straight during this exercise. 2. Keeping your head and back in a straight line, bend at your waist to reach for your left foot (position A). You should feel a stretch in your right inner thigh. 3. Hold for __________ seconds. Then slowly return to the upright position. 4. Keeping your head and back in a straight line, bend at your waist to reach forward (position B). You should feel a stretch behind both of your thighs or knees. 5. Hold for __________ seconds. Then slowly return to the upright position. 6. Keeping your head and back in a straight line, bend at your waist to reach for your right foot (position C). You should feel a stretch in your left inner thigh. 7. Hold for __________ seconds. Then slowly return to the upright position. Repeat __________ times. Complete this exercise __________ times a day. Exercise C: Quadriceps, prone  1. Lie on your abdomen on a firm surface, such as a bed or padded floor. 2. Bend your left / right knee and hold your ankle. If you cannot reach your ankle or pant leg, loop a belt around your foot and grab the  belt instead. 3. Gently pull your heel toward your buttocks. Your knee should not slide out to the side. You should feel a stretch in the front of your thigh and knee. 4. Hold this position for __________ seconds. Repeat __________ times. Complete this stretch __________ times a day. Strengthening exercises These exercises build strength and endurance in your knee and hip. Endurance is the ability to use your muscles for a long time, even after they get tired. Exercise D: Terminal knee extension, quadriceps 1. Secure a long loop  of rubber exercise band around a sturdy object like a table leg. 2. Put the band behind your left / right knee. Step back from where the band is secured to put tension on the band. 3. Slowly bend your left / right knee. Keep your left / right foot flat on the floor. 4. Tighten the muscle in the front of your thigh and push back against the band to straighten your knee. 5. Hold this position for __________ seconds. 6. Return to having your left / right knee bent. Repeat __________ times. Complete this stretch __________ times a day. Exercise E: Straight leg raises (hip abductors) 1. Lie on your side with your left / right leg in the top position. Your head, shoulder, knee, and hip should line up. You may bend your bottom knee to help you balance. 2. Lift your top leg 4-6 inches (10-15 cm) while keeping your toes pointed straight ahead. 3. Hold this position for __________ seconds. 4. Slowly lower your leg to the starting position. 5. Allow your muscles to relax completely after each repetition. Repeat __________ times. Complete this exercise __________ times a day. This information is not intended to replace advice given to you by your health care provider. Make sure you discuss any questions you have with your health care provider. Document Released: 11/08/2005 Document Revised: 07/13/2016 Document Reviewed: 10/24/2015 Elsevier Interactive Patient Education  2019 [employer]  Pes Anserine Bursitis  The pes anserine is an area on the inside of your knee, just below the joint, that is cushioned by a fluid-filled sac (bursa). Pes anserine bursitis is a condition that happens when the bursa gets swollen and irritated. The condition causes knee pain. What are the causes? This condition may be caused by:  Making the same movement over and over.  A direct hit (trauma) to the inside of the leg. What increases the risk? You are more likely to develop this condition if you:  Are a  runner.  Play sports that involve a lot of running and quick side-to-side movements (cutting).  Are an athlete who plays contact sports.  Swim using an inward angle of the knee, such as with the breaststroke.  Have tight hamstring muscles.  Are a woman.  Are overweight.  Have flat feet.  Have diabetes or osteoarthritis. What are the signs or symptoms? Symptoms of this condition include:  Knee pain that gets better with rest and worse with activities like climbing stairs, walking, running, or getting in and out of a chair.  Swelling.  Warmth.  Tenderness when pressing at the inside of the lower leg, just below the knee. How is this diagnosed? This condition may be diagnosed based on:  Your symptoms.  Your medical history.  A physical exam. ? During your physical exam, your health care provider will press on the tendon attachment to see if you feel pain. ? Your health care provider may also check your hip and knee motion and strength.  Tests  to check for swelling and fluid buildup in the bursa and to look at muscles, bones, and tendons. These tests might include: ? X-rays. ? MRI. ? Ultrasound. How is this treated? This condition may be treated by:  Resting your knee. You may be told to raise (elevate) your knee while resting.  Avoiding activities that cause pain.  Icing the inside of your knee.  Sleeping with a pillow between your knees. This will cushion your injured knee.  Taking medicine by mouth (orally) to reduce pain and swelling or having medicine injected into your knee.  Doing strengthening and stretching exercises (physical therapy). If these treatments do not work or if the condition keeps coming back, you may need to have surgery to remove the bursa. Follow these instructions at home: Managing pain, stiffness, and swelling   If directed, put ice on the injured area. ? Put ice in a plastic bag. ? Place a towel between your skin and the  bag. ? Leave the ice on for 20 minutes, 2-3 times a day.  Elevate the injured area above the level of your heart while you are sitting or lying down. Activity  Return to your normal activities as told by your health care provider. Ask your health care provider what activities are safe for you.  Do exercises as told by your health care provider. General instructions  Take over-the-counter and prescription medicines only as told by your health care provider.  Sleep with a pillow between your knees.  Do not use any products that contain nicotine or tobacco, such as cigarettes, e-cigarettes, and chewing tobacco. These can delay healing. If you need help quitting, ask your health care provider.  If you are overweight, work with your health care provider and a dietitian to set a weight-loss goal that is healthy and reasonable for you.  Keep all follow-up visits as told by your health care provider. This is important. How is this prevented?  When exercising, make sure that you: ? Warm up and stretch before being active. ? Cool down and stretch after being active. ? Give your body time to rest between periods of activity. ? Use equipment that fits you. ? Are safe and responsible while being active to avoid falls. ? Do at least 150 minutes of moderate-intensity exercise each week, such as brisk walking or water aerobics. ? Maintain physical fitness, including:  Strength.  Flexibility.  Cardiovascular fitness.  Endurance. ? Maintain a healthy weight. Contact a health care provider if:  Your symptoms do not improve.  Your symptoms get worse. Summary  Pes anserine bursitis is a condition that happens when the fluid-filled sac (bursa) at the inside of your knee gets swollen and irritated. The condition causes knee pain.  Treatment for pes anserine bursitis may include resting your knee, icing the inside of your knee, sleeping with a pillow between your knees, taking medicine by  mouth or by injection, and doing strengthening and stretching exercises (physical therapy).  Follow instructions for managing pain, stiffness, and swelling.  Take over-the-counter and prescription medicines only as told by your health care provider. This information is not intended to replace advice given to you by your health care provider. Make sure you discuss any questions you have with your health care provider. Document Released: 11/08/2005 Document Revised: 04/19/2018 Document Reviewed: 04/19/2018 Elsevier Interactive Patient Education  2019 [employer].

## 2019-01-26 ENCOUNTER — Encounter: Payer: Self-pay | Admitting: Family Medicine

## 2019-01-26 ENCOUNTER — Ambulatory Visit (INDEPENDENT_AMBULATORY_CARE_PROVIDER_SITE_OTHER): Payer: Medicare Other | Admitting: Family Medicine

## 2019-01-26 ENCOUNTER — Other Ambulatory Visit: Payer: Self-pay

## 2019-01-26 VITALS — BP 118/71 | HR 77 | Temp 98.0°F | Resp 16 | Ht 62.8 in | Wt 148.0 lb

## 2019-01-26 DIAGNOSIS — L659 Nonscarring hair loss, unspecified: Secondary | ICD-10-CM

## 2019-01-26 DIAGNOSIS — Z0000 Encounter for general adult medical examination without abnormal findings: Secondary | ICD-10-CM

## 2019-01-26 DIAGNOSIS — E0590 Thyrotoxicosis, unspecified without thyrotoxic crisis or storm: Secondary | ICD-10-CM

## 2019-01-26 DIAGNOSIS — Z1211 Encounter for screening for malignant neoplasm of colon: Secondary | ICD-10-CM

## 2019-01-26 DIAGNOSIS — Z0001 Encounter for general adult medical examination with abnormal findings: Secondary | ICD-10-CM | POA: Diagnosis not present

## 2019-01-26 DIAGNOSIS — R252 Cramp and spasm: Secondary | ICD-10-CM

## 2019-01-26 DIAGNOSIS — E785 Hyperlipidemia, unspecified: Secondary | ICD-10-CM

## 2019-01-26 MED ORDER — SPIRONOLACTONE 100 MG PO TABS: 100.0000 mg | ORAL_TABLET | Freq: Every day | ORAL | 0 refills | Status: DC

## 2019-01-26 NOTE — Progress Notes (Signed)
QUICK REFERENCE INFORMATION: The ABCs of Providing the Annual Wellness Visit  CMS.gov Medicare Learning Network  [employer] Annual Wellness Visit  Subjective:   Maria Bautista is a 68 y.o. Female who presents for an Annual Wellness Visit.  Hyperthyroidism Pt reports that she was diagnosed with Hyperthyroidism This was back in [place] She has a strong family history with her mother and daughter both with thyroid disease She had an iodine scan but   She has leg cramps  She has thinning hair and takes spironolactone to decrease the hair loss She was diagnosed with RA but she states that Rheumatologist told her she did not have RA  She has a history of hyperthyroidism But has never been treated She also has a history of goiter and was told that the thyroid needs to be removed She reports trouble swallowing   Patient Active Problem List   Diagnosis Date Noted  . Osteoarthritis 08/28/2015  . Thinning hair 02/17/2015  . Borderline hyperglycemia 02/17/2015    Past Medical History:  Diagnosis Date  . Allergy   . Arthritis      Past Surgical History:  Procedure Laterality Date  . ABDOMINAL HYSTERECTOMY    . APPENDECTOMY    . BREAST EXCISIONAL BIOPSY Left   . CESAREAN SECTION    . FRACTURE SURGERY    . SPINE SURGERY       Outpatient Medications Prior to Visit  Medication Sig Dispense Refill  . Ascorbic Acid (VITAMIN C) 100 MG tablet Take 100 mg by mouth daily.    Marland Kitchen aspirin 81 MG tablet Take 81 mg by mouth daily.    . calcium carbonate (OS-CAL) 600 MG TABS tablet Take 600 mg by mouth 2 (two) times daily with a meal.    . cetirizine (ZYRTEC) 10 MG tablet TAKE 1 TABLET BY MOUTH EVERY DAY 90 tablet 0  . cholecalciferol (VITAMIN D) 1000 UNITS tablet Take 1,000 Units by mouth daily.    . Cranberry 500 MG CAPS Take by mouth.    . cyclobenzaprine (FLEXERIL) 5 MG tablet Take 1-2 tablets (5-10 mg total) by mouth 3 (three) times daily as needed for muscle spasms. 30 tablet 0  .  ibuprofen (ADVIL,MOTRIN) 600 MG tablet Take 1 tablet (600 mg total) by mouth every 8 (eight) hours as needed. 30 tablet 0  . ipratropium (ATROVENT) 0.03 % nasal spray Place 2 sprays into both nostrils 2 (two) times daily. 30 mL 0  . omeprazole (PRILOSEC) 10 MG capsule Take 10 mg by mouth daily.    Marland Kitchen spironolactone (ALDACTONE) 100 MG tablet TAKE 1 TABLET BY MOUTH  DAILY 90 tablet 0   No facility-administered medications prior to visit.     Allergies  Allergen Reactions  . Codeine      Family History  Problem Relation Age of Onset  . Heart disease Mother   . Hyperlipidemia Mother   . Diabetes Father      Social History   Socioeconomic History  . Marital status: Divorced    Spouse name: Not on file  . Number of children: 2  . Years of education: Not on file  . Highest education level: Not on file  Occupational History  . Occupation: child care    Comment: [employer]  Social Needs  . Financial resource strain: Not on file  . Food insecurity:    Worry: Not on file    Inability: Not on file  . Transportation needs:    Medical: Not  on file    Non-medical: Not on file  Tobacco Use  . Smoking status: Never Smoker  . Smokeless tobacco: Never Used  Substance and Sexual Activity  . Alcohol use: No    Alcohol/week: 0.0 standard drinks  . Drug use: No  . Sexual activity: Yes    Birth control/protection: Surgical  Lifestyle  . Physical activity:    Days per week: Not on file    Minutes per session: Not on file  . Stress: Not on file  Relationships  . Social connections:    Talks on phone: Not on file    Gets together: Not on file    Attends religious service: Not on file    Active member of club or organization: Not on file    Attends meetings of clubs or organizations: Not on file    Relationship status: Not on file  Other Topics Concern  . Not on file  Social History Narrative   Lives alone.   Divorced 12/2016 from second husband  following 6 years of marriage.   Daughters are adults and live independently.      Recent Hospitalizations? No  Health Habits: Current exercise activities include: none Exercise: 0 times/week. Diet: in general, a "healthy" diet    Alcohol intake: none  Health Risk Assessment: The patient has completed a Health Risk Assessment. This has been reveiwed with them and has been scanned into the [place] system as an attached document.  Current Medical Providers and Suppliers: [facility] Patient Care Team: Forrest Moron, MD as PCP - General (Internal Medicine) Future Appointments  Date Time Provider [place]  02/26/2019  2:10 PM [facility] MM 2 [facility]     Age-appropriate Screening Schedule: The list below includes current immunization status and future screening recommendations based on patient's age. Orders for these recommended tests are listed in the plan section. The patient has been provided with a written plan. Immunization History  Administered Date(s) Administered  . Tdap 11/23/2011    Health Maintenance reviewed -  patient asked to schedule her mammogram   Depression Screen-PHQ2/9 completed today  Depression screen [facility] 2/9 01/26/2019 01/22/2019 10/10/2018 04/28/2018 12/19/2017  Decreased Interest 0 0 0 0 0  Down, Depressed, Hopeless 0 0 0 0 0  PHQ - 2 Score 0 0 0 0 0       Depression Severity and Treatment Recommendations:  0-4= None  5-9= Mild / Treatment: Support, educate to call if worse; return in one month  10-14= Moderate / Treatment: Support, watchful waiting; Antidepressant or Psycotherapy  15-19= Moderately severe / Treatment: Antidepressant OR Psychotherapy  >= 20 = Major depression, severe / Antidepressant AND Psychotherapy  Functional Status Survey:   Is the patient deaf or have difficulty hearing?: No Does the patient have difficulty seeing, even when wearing glasses/contacts?: No Does the patient have difficulty concentrating,  remembering, or making decisions?: No Does the patient have difficulty walking or climbing stairs?: No Does the patient have difficulty dressing or bathing?: No Does the patient have difficulty doing errands alone such as visiting a doctor's office or shopping?: No    Advanced Care Planning: 1. Patient has executed an Advance Directive: Yes 2. If no, patient was given the opportunity to execute an Advance Directive today?  3. Are the patient's advanced directives in [place]? No 4. This patient has the ability to prepare an Advance Directive: Yes 5. Provider is willing to follow the patient's wishes: Yes  Cognitive Assessment: Does the patient have evidence  of cognitive impairment? No The patient does not have any evidence of any cognitive problems and denies any  change in mood/affect, appearance, speech, memory or motor skills.    ROS  Review of Systems  Constitutional: Negative for activity change, appetite change, chills and fever. +fatigue HENT: Negative for congestion, nosebleeds, +trouble swallowing + voice change.   Respiratory: Negative for cough, shortness of breath and wheezing.   Gastrointestinal: Negative for diarrhea, nausea and vomiting.  Genitourinary: Negative for difficulty urinating, dysuria, flank pain and hematuria.  Musculoskeletal: Negative for back pain, joint swelling and neck pain. +knee pain + muscle cramps Neurological: Negative for dizziness, speech difficulty, light-headedness and numbness.  Skin: dry skin  See HPI. All other review of systems negative.    Objective:   Vitals:   01/26/19 0813  BP: 118/71  Pulse: 77  Resp: 16  Temp: 98 F (36.7 C)  TempSrc: Oral  SpO2: 100%  Weight: 148 lb (67.1 kg)  Height: 5' 2.8" (1.595 m)    Body mass index is 26.39 kg/m.  Physical Exam  Constitutional: Oriented to person, place, and time. Appears well-developed and well-nourished.  HENT:  Head: Normocephalic and atraumatic.  Eyes: Conjunctivae and  EOM are normal.  Neck: thyromegaly, with soft mobile thyroid, nontender Cardiovascular: Normal rate, regular rhythm, normal heart sounds and intact distal pulses.  No murmur heard. Pulmonary/Chest: Effort normal and breath sounds normal. No stridor. No respiratory distress. Has no wheezes.  Abdomen: non-distended, normoactive bs, soft, non-tender  Neurological: Is alert and oriented to person, place, and time.  Skin: Skin is warm. Capillary refill takes less than 2 seconds.  Psychiatric: Has a normal mood and affect. Behavior is normal. Judgment and thought content normal.     Assessment/Plan:   Patient Self-Management and Personalized Health Advice The patient has been provided with information about:  begin progressive daily aerobic exercise program and attempt to lose weight  During the course of the visit the patient was educated and counseled about appropriate screening and preventive services including:   medication refills are given     Body mass index is 26.39 kg/m. Discussed the patient's BMI with her. The BMI BMI is not in the acceptable range; BMI management plan is completed  Lulla was seen today for medicare wellness.  Diagnoses and all orders for this visit:  Encounter for Medicare annual wellness exam- mammogram scheduled Age appropriate screenings reviewed Pt s/p hysterectomy  Special screening for malignant neoplasms, colon-   -     Cologuard  Hyperthyroidism- pt has been inadequately worked up Discussed that she is now treating symptoms of hyperthyroidism such as hair loss   -     TSH+T4F+T3Free -     Ambulatory referral to Endocrinology -     Ferritin -     CBC with Differential/Platelet  Dyslipidemia -     Lipid panel -     CMP14+EGFR  Muscle cramps -     Ferritin -     CBC with Differential/Platelet      No follow-ups on file.  Future Appointments  Date Time Provider [place]  02/26/2019  2:10 PM [facility] MM 2 [facility]  [facility]    Patient Instructions       If you have lab work done today you will be contacted with your lab results within the next 2 weeks.  If you have not heard from [place] then please contact us. The fastest way to get your results is to register for My  Chart.   IF you received an x-ray today, you will receive an invoice from [facility]. Please contact [facility] at [phone] with questions or concerns regarding your invoice.   IF you received labwork today, you will receive an invoice from [place]. Please contact [facility] at [phone] with questions or concerns regarding your invoice.   Our billing staff will not be able to assist you with questions regarding bills from these companies.  You will be contacted with the lab results as soon as they are available. The fastest way to get your results is to activate your My Chart account. Instructions are located on the last page of this paperwork. If you have not heard from [place] regarding the results in 2 weeks, please contact this office.        An after visit summary with all of these plans was given to the patient.

## 2019-01-26 NOTE — Patient Instructions (Addendum)
   If you have lab work done today you will be contacted with your lab results within the next 2 weeks.  If you have not heard from us then please contact us. The fastest way to get your results is to register for My Chart.   IF you received an x-ray today, you will receive an invoice from [facility]. Please contact [facility] at [phone] with questions or concerns regarding your invoice.   IF you received labwork today, you will receive an invoice from [facility]. Please contact [facility] at [phone] with questions or concerns regarding your invoice.   Our billing staff will not be able to assist you with questions regarding bills from these companies.  You will be contacted with the lab results as soon as they are available. The fastest way to get your results is to activate your My Chart account. Instructions are located on the last page of this paperwork. If you have not heard from us regarding the results in 2 weeks, please contact this office.     Hyperthyroidism  Hyperthyroidism is when the thyroid gland is too active (overactive). The thyroid gland is a small gland located in the lower front part of the neck, just in front of the windpipe (trachea). This gland makes hormones that help control how the body uses food for energy (metabolism) as well as how the heart and brain function. These hormones also play a role in keeping your bones strong. When the thyroid is overactive, it produces too much of a hormone called thyroxine. What are the causes? This condition may be caused by:  Graves' disease. This is a disorder in which the body's disease-fighting system (immune system) attacks the thyroid gland. This is the most common cause.  Inflammation of the thyroid gland.  A tumor in the thyroid gland.  Use of certain medicines, including: ? Prescription thyroid hormone replacement. ? Herbal supplements that mimic thyroid hormones. ? Amiodarone  therapy.  Solid or fluid-filled lumps within your thyroid gland (thyroid nodules).  Taking in a large amount of iodine from foods or medicines. What increases the risk? You are more likely to develop this condition if:  You are female.  You have a family history of thyroid conditions.  You smoke tobacco.  You use a medicine called lithium.  You take medicines that affect the immune system (immunosuppressants). What are the signs or symptoms? Symptoms of this condition include:  Nervousness.  Inability to tolerate heat.  Unexplained weight loss.  Diarrhea.  Change in the texture of hair or skin.  Heart skipping beats or making extra beats.  Rapid heart rate.  Loss of menstruation.  Shaky hands.  Fatigue.  Restlessness.  Sleep problems.  Enlarged thyroid gland or a lump in the thyroid (nodule). You may also have symptoms of Graves' disease, which may include:  Protruding eyes.  Dry eyes.  Red or swollen eyes.  Problems with vision. How is this diagnosed? This condition may be diagnosed based on:  Your symptoms and medical history.  A physical exam.  Blood tests.  Thyroid ultrasound. This test involves using sound waves to produce images of the thyroid gland.  A thyroid scan. A radioactive substance is injected into a vein, and images show how much iodine is present in the thyroid.  Radioactive iodine uptake test (RAIU). A small amount of radioactive iodine is given by mouth to see how much iodine the thyroid absorbs after a certain amount of time. How is this treated? Treatment   depends on the cause and severity of the condition. Treatment may include:  Medicines to reduce the amount of thyroid hormone your body makes.  Radioactive iodine treatment (radioiodine therapy). This involves swallowing a small dose of radioactive iodine, in capsule or liquid form, to kill thyroid cells.  Surgery to remove part or all of your thyroid gland. You may need  to take thyroid hormone replacement medicine for the rest of your life after thyroid surgery.  Medicines to help manage your symptoms. Follow these instructions at home:   Take over-the-counter and prescription medicines only as told by your health care provider.  Do not use any products that contain nicotine or tobacco, such as cigarettes and e-cigarettes. If you need help quitting, ask your health care provider.  Follow any instructions from your health care provider about diet. You may be instructed to limit foods that contain iodine.  Keep all follow-up visits as told by your health care provider. This is important. ? You will need to have blood tests regularly so that your health care provider can monitor your condition. Contact a health care provider if:  Your symptoms do not get better with treatment.  You have a fever.  You are taking thyroid hormone replacement medicine and you: ? Have symptoms of depression. ? Feel like you are tired all the time. ? Gain weight. Get help right away if:  You have chest pain.  You have decreased alertness or a change in your awareness.  You have abdominal pain.  You feel dizzy.  You have a rapid heartbeat.  You have an irregular heartbeat.  You have difficulty breathing. Summary  The thyroid gland is a small gland located in the lower front part of the neck, just in front of the windpipe (trachea).  Hyperthyroidism is when the thyroid gland is too active (overactive) and produces too much of a hormone called thyroxine.  The most common cause is Graves' disease, a disorder in which your immune system attacks the thyroid gland.  Hyperthyroidism can cause various symptoms, such as unexplained weight loss, nervousness, inability to tolerate heat, or changes in your heartbeat.  Treatment may include medicine to reduce the amount of thyroid hormone your body makes, radioiodine therapy, surgery, or medicines to manage symptoms. This  information is not intended to replace advice given to you by your health care provider. Make sure you discuss any questions you have with your health care provider. Document Released: 11/08/2005 Document Revised: 10/19/2017 Document Reviewed: 10/19/2017 Elsevier Interactive Patient Education  2019 Elsevier Inc.  

## 2019-01-27 LAB — CBC WITH DIFFERENTIAL/PLATELET
Basophils Absolute: 0.1 10*3/uL (ref 0.0–0.2)
Basos: 2 %
EOS (ABSOLUTE): 0.1 10*3/uL (ref 0.0–0.4)
Eos: 2 %
Hematocrit: 40.4 % (ref 34.0–46.6)
Hemoglobin: 13.1 g/dL (ref 11.1–15.9)
Immature Grans (Abs): 0 10*3/uL (ref 0.0–0.1)
Immature Granulocytes: 0 %
Lymphocytes Absolute: 1.2 10*3/uL (ref 0.7–3.1)
Lymphs: 30 %
MCH: 28.9 pg (ref 26.6–33.0)
MCHC: 32.4 g/dL (ref 31.5–35.7)
MCV: 89 fL (ref 79–97)
Monocytes Absolute: 0.4 10*3/uL (ref 0.1–0.9)
Monocytes: 11 %
Neutrophils Absolute: 2.3 10*3/uL (ref 1.4–7.0)
Neutrophils: 55 %
Platelets: 361 10*3/uL (ref 150–450)
RBC: 4.53 x10E6/uL (ref 3.77–5.28)
RDW: 11.8 % (ref 11.7–15.4)
WBC: 4.1 10*3/uL (ref 3.4–10.8)

## 2019-01-27 LAB — LIPID PANEL
Chol/HDL Ratio: 3.5 ratio (ref 0.0–4.4)
Cholesterol, Total: 190 mg/dL (ref 100–199)
HDL: 55 mg/dL (ref >39)
LDL Calculated: 118 mg/dL — ABNORMAL HIGH (ref 0–99)
Triglycerides: 85 mg/dL (ref 0–149)
VLDL Cholesterol Cal: 17 mg/dL (ref 5–40)

## 2019-01-27 LAB — CMP14+EGFR
ALT: 19 IU/L (ref 0–32)
AST: 19 IU/L (ref 0–40)
Albumin/Globulin Ratio: 2 (ref 1.2–2.2)
Albumin: 4.7 g/dL (ref 3.8–4.8)
Alkaline Phosphatase: 75 IU/L (ref 39–117)
BUN/Creatinine Ratio: 15 (ref 12–28)
BUN: 10 mg/dL (ref 8–27)
Bilirubin Total: 0.7 mg/dL (ref 0.0–1.2)
CO2: 22 mmol/L (ref 20–29)
Calcium: 10.6 mg/dL — ABNORMAL HIGH (ref 8.7–10.3)
Chloride: 103 mmol/L (ref 96–106)
Creatinine, Ser: 0.68 mg/dL (ref 0.57–1.00)
GFR calc Af Amer: 103 mL/min/{1.73_m2} (ref >59)
GFR calc non Af Amer: 90 mL/min/{1.73_m2} (ref >59)
Globulin, Total: 2.3 g/dL (ref 1.5–4.5)
Glucose: 105 mg/dL — ABNORMAL HIGH (ref 65–99)
Potassium: 4.8 mmol/L (ref 3.5–5.2)
Sodium: 138 mmol/L (ref 134–144)
Total Protein: 7 g/dL (ref 6.0–8.5)

## 2019-01-27 LAB — TSH+T4F+T3FREE
Free T4: 1.33 ng/dL (ref 0.82–1.77)
T3, Free: 3.4 pg/mL (ref 2.0–4.4)
TSH: 0.647 u[IU]/mL (ref 0.450–4.500)

## 2019-01-27 LAB — FERRITIN: Ferritin: 90 ng/mL (ref 15–150)

## 2019-01-29 ENCOUNTER — Ambulatory Visit: Payer: Medicare Other

## 2019-01-30 DIAGNOSIS — M25562 Pain in left knee: Secondary | ICD-10-CM | POA: Diagnosis not present

## 2019-02-15 DIAGNOSIS — M1712 Unilateral primary osteoarthritis, left knee: Secondary | ICD-10-CM | POA: Diagnosis not present

## 2019-02-23 DIAGNOSIS — M1712 Unilateral primary osteoarthritis, left knee: Secondary | ICD-10-CM | POA: Diagnosis not present

## 2019-02-26 ENCOUNTER — Ambulatory Visit: Payer: Medicare Other

## 2019-02-26 DIAGNOSIS — Z1211 Encounter for screening for malignant neoplasm of colon: Secondary | ICD-10-CM | POA: Diagnosis not present

## 2019-03-01 DIAGNOSIS — M1712 Unilateral primary osteoarthritis, left knee: Secondary | ICD-10-CM | POA: Diagnosis not present

## 2019-03-07 LAB — COLOGUARD: Cologuard: NEGATIVE

## 2019-03-14 ENCOUNTER — Ambulatory Visit: Payer: Medicare Other

## 2019-03-29 ENCOUNTER — Other Ambulatory Visit: Payer: Self-pay | Admitting: Family Medicine

## 2019-03-29 DIAGNOSIS — L659 Nonscarring hair loss, unspecified: Secondary | ICD-10-CM

## 2019-04-24 DIAGNOSIS — M1712 Unilateral primary osteoarthritis, left knee: Secondary | ICD-10-CM | POA: Diagnosis not present

## 2019-05-01 ENCOUNTER — Other Ambulatory Visit: Payer: Self-pay

## 2019-05-01 ENCOUNTER — Ambulatory Visit
Admission: RE | Admit: 2019-05-01 | Discharge: 2019-05-01 | Disposition: A | Discharge status: Home / Self Care | Payer: Medicare Other | Source: Ambulatory Visit | Attending: Family Medicine | Admitting: Family Medicine

## 2019-05-01 DIAGNOSIS — Z1231 Encounter for screening mammogram for malignant neoplasm of breast: Secondary | ICD-10-CM | POA: Diagnosis not present

## 2019-05-04 DIAGNOSIS — M25562 Pain in left knee: Secondary | ICD-10-CM | POA: Diagnosis not present

## 2019-05-07 DIAGNOSIS — D485 Neoplasm of uncertain behavior of skin: Secondary | ICD-10-CM | POA: Diagnosis not present

## 2019-05-07 DIAGNOSIS — L859 Epidermal thickening, unspecified: Secondary | ICD-10-CM | POA: Diagnosis not present

## 2019-05-07 DIAGNOSIS — L988 Other specified disorders of the skin and subcutaneous tissue: Secondary | ICD-10-CM | POA: Diagnosis not present

## 2019-05-08 DIAGNOSIS — M25562 Pain in left knee: Secondary | ICD-10-CM | POA: Diagnosis not present

## 2019-05-14 DIAGNOSIS — M1712 Unilateral primary osteoarthritis, left knee: Secondary | ICD-10-CM | POA: Diagnosis not present

## 2019-05-17 ENCOUNTER — Other Ambulatory Visit: Payer: Self-pay

## 2019-05-17 ENCOUNTER — Encounter: Payer: Self-pay | Admitting: Family Medicine

## 2019-05-17 ENCOUNTER — Ambulatory Visit (INDEPENDENT_AMBULATORY_CARE_PROVIDER_SITE_OTHER): Payer: Medicare Other | Admitting: Family Medicine

## 2019-05-17 VITALS — BP 125/70 | HR 81 | Temp 98.4°F | Resp 16 | Ht 62.0 in | Wt 152.0 lb

## 2019-05-17 DIAGNOSIS — I10 Essential (primary) hypertension: Secondary | ICD-10-CM | POA: Insufficient documentation

## 2019-05-17 DIAGNOSIS — Z01818 Encounter for other preprocedural examination: Secondary | ICD-10-CM | POA: Insufficient documentation

## 2019-05-17 DIAGNOSIS — R9431 Abnormal electrocardiogram [ECG] [EKG]: Secondary | ICD-10-CM

## 2019-05-17 DIAGNOSIS — M8580 Other specified disorders of bone density and structure, unspecified site: Secondary | ICD-10-CM

## 2019-05-17 DIAGNOSIS — M8949 Other hypertrophic osteoarthropathy, multiple sites: Secondary | ICD-10-CM

## 2019-05-17 DIAGNOSIS — M150 Primary generalized (osteo)arthritis: Secondary | ICD-10-CM

## 2019-05-17 DIAGNOSIS — M159 Polyosteoarthritis, unspecified: Secondary | ICD-10-CM

## 2019-05-17 LAB — CBC WITH DIFFERENTIAL/PLATELET
Basophils Absolute: 0 10*3/uL (ref 0.0–0.2)
Basos: 1 %
EOS (ABSOLUTE): 0.1 10*3/uL (ref 0.0–0.4)
Eos: 2 %
Hematocrit: 38.6 % (ref 34.0–46.6)
Hemoglobin: 12.6 g/dL (ref 11.1–15.9)
Immature Grans (Abs): 0 10*3/uL (ref 0.0–0.1)
Immature Granulocytes: 0 %
Lymphocytes Absolute: 1.6 10*3/uL (ref 0.7–3.1)
Lymphs: 37 %
MCH: 29.3 pg (ref 26.6–33.0)
MCHC: 32.6 g/dL (ref 31.5–35.7)
MCV: 90 fL (ref 79–97)
Monocytes Absolute: 0.5 10*3/uL (ref 0.1–0.9)
Monocytes: 11 %
Neutrophils Absolute: 2.1 10*3/uL (ref 1.4–7.0)
Neutrophils: 49 %
Platelets: 355 10*3/uL (ref 150–450)
RBC: 4.3 x10E6/uL (ref 3.77–5.28)
RDW: 12.1 % (ref 11.7–15.4)
WBC: 4.3 10*3/uL (ref 3.4–10.8)

## 2019-05-17 NOTE — Progress Notes (Signed)
Established Patient Office Visit  Subjective:  Patient ID: Maria Bautista, female    DOB: January 07, 1951  Age: 68 y.o. MRN: [id number]  CC:  Chief Complaint  Patient presents with  . Surgical Clearance    HPI Maria Bautista presents for surgical clearance for total left knee replacement-Dr Wainer-Orthopedic surgery Pt with h/o lumbar disc disease-surgery in the past-recommendation for general anesthesia  Thyroid -goiter in the past-enlarged with recommendation for possible removal-pt with hypothyroid symptoms but labwork normal   Past Medical History:  Diagnosis Date  . Allergy   . Arthritis     Past Surgical History:  Procedure Laterality Date  . ABDOMINAL HYSTERECTOMY    . APPENDECTOMY    . BREAST EXCISIONAL BIOPSY Left   . CESAREAN SECTION    . FRACTURE SURGERY    . SPINE SURGERY      Family History  Problem Relation Age of Onset  . Heart disease Mother   . Hyperlipidemia Mother   . Diabetes Father     Social History   Socioeconomic History  . Marital status: Divorced    Spouse name: Not on file  . Number of children: 2  . Years of education: Not on file  . Highest education level: Not on file  Occupational History  . Occupation: child care    Comment: [employer]  Social Needs  . Financial resource strain: Not on file  . Food insecurity    Worry: Not on file    Inability: Not on file  . Transportation needs    Medical: Not on file    Non-medical: Not on file  Tobacco Use  . Smoking status: Never Smoker  . Smokeless tobacco: Never Used  Substance and Sexual Activity  . Alcohol use: No    Alcohol/week: 0.0 standard drinks  . Drug use: No  . Sexual activity: Yes    Birth control/protection: Surgical  Lifestyle  . Physical activity    Days per week: Not on file    Minutes per session: Not on file  . Stress: Not on file  Relationships  . Social Herbalist on phone: Not on file    Gets together: Not on file     Attends religious service: Not on file    Active member of club or organization: Not on file    Attends meetings of clubs or organizations: Not on file    Relationship status: Not on file  . Intimate partner violence    Fear of current or ex partner: Not on file    Emotionally abused: Not on file    Physically abused: Not on file    Forced sexual activity: Not on file  Other Topics Concern  . Not on file  Social History Narrative   Lives alone.   Divorced 12/2016 from second husband following 67 years of marriage.   Daughters are adults and live independently.   Granddaughter lives with grandmother-24yo Outpatient Medications Prior to Visit  Medication Sig Dispense Refill  . Ascorbic Acid (VITAMIN C) 100 MG tablet Take 100 mg by mouth daily.    Marland Kitchen aspirin 81 MG tablet Take 81 mg by mouth daily.    . calcium carbonate (OS-CAL) 600 MG TABS tablet Take 600 mg by mouth 2 (two) times daily with a meal.    . cholecalciferol (VITAMIN D) 1000 UNITS tablet Take 1,000 Units by mouth daily.    . Cranberry 500 MG CAPS Take by mouth.    Marland Kitchen  ipratropium (ATROVENT) 0.03 % nasal spray Place 2 sprays into both nostrils 2 (two) times daily. 30 mL 0  . omeprazole (PRILOSEC) 10 MG capsule Take 10 mg by mouth daily.    Marland Kitchen spironolactone (ALDACTONE) 100 MG tablet TAKE 1 TABLET BY MOUTH  DAILY 90 tablet 0  . cetirizine (ZYRTEC) 10 MG tablet TAKE 1 TABLET BY MOUTH EVERY DAY 90 tablet 0  . cyclobenzaprine (FLEXERIL) 5 MG tablet Take 1-2 tablets (5-10 mg total) by mouth 3 (three) times daily as needed for muscle spasms. 30 tablet 0  . ibuprofen (ADVIL,MOTRIN) 600 MG tablet Take 1 tablet (600 mg total) by mouth every 8 (eight) hours as needed. 30 tablet 0   No facility-administered medications prior to visit.     Allergies  Allergen Reactions  . Codeine     ROS Review of Systems  Constitutional: Negative for fatigue and fever.  HENT: Positive for trouble swallowing. Negative for sore throat.    Respiratory: Negative for cough.   Gastrointestinal: Negative for constipation and diarrhea.  Genitourinary: Negative for dysuria.  Musculoskeletal: Positive for joint swelling.  Neurological: Negative for dizziness.  enlarged thyroid-cyst /goiter-TSH normal 3/20-took low dose thyroid meds in the past Right forearm-lesion-biopsy-neg for abnormal per pt   Objective:     Today's Vitals   05/17/19 0935  BP: 125/70  Pulse: 81  Resp: 16  Temp: 98.4 F (36.9 C)  TempSrc: Oral  SpO2: 98%  Weight: 152 lb (68.9 kg)  Height: _0  (1.575 m)   Body mass index is 27.8 kg/m. Physical Exam  Constitutional: She appears well-developed and well-nourished. No distress.  HENT:  Head: Normocephalic and atraumatic.  Right Ear: External ear normal.  Left Ear: External ear normal.  Nose: Nose normal.  Mouth/Throat: Oropharynx is clear and moist. No oropharyngeal exudate.  Eyes: Conjunctivae are normal.  Neck: Normal range of motion. Neck supple.  Cardiovascular: Normal rate, regular rhythm and normal heart sounds.  Pulmonary/Chest: Effort normal and breath sounds normal.  Abdominal: Soft. Bowel sounds are normal.  Neurological: She is alert.  Psychiatric: She has a normal mood and affect.      Wt Readings from Last 3 Encounters:  01/26/19 148 lb (67.1 kg)  01/22/19 151 lb 6.4 oz (68.7 kg)  10/10/18 150 lb (68 kg)     Lab Results  Component Value Date   TSH 0.647 01/26/2019   Lab Results  Component Value Date   WBC 4.1 01/26/2019   HGB 13.1 01/26/2019   HCT 40.4 01/26/2019   MCV 89 01/26/2019   PLT 361 01/26/2019   Lab Results  Component Value Date   NA 138 01/26/2019   K 4.8 01/26/2019   CO2 22 01/26/2019   GLUCOSE 105 (H) 01/26/2019   BUN 10 01/26/2019   CREATININE 0.68 01/26/2019   BILITOT 0.7 01/26/2019   ALKPHOS 75 01/26/2019   AST 19 01/26/2019   ALT 19 01/26/2019   PROT 7.0 01/26/2019   ALBUMIN 4.7 01/26/2019   CALCIUM 10.6 (H) 01/26/2019   Lab Results   Component Value Date   CHOL 190 01/26/2019   Lab Results  Component Value Date   HDL 55 01/26/2019   Lab Results  Component Value Date   LDLCALC 118 (H) 01/26/2019   Lab Results  Component Value Date   TRIG 85 01/26/2019   Lab Results  Component Value Date   CHOLHDL 3.5 01/26/2019   Lab Results  Component Value Date   HGBA1C 5.4 11/23/2016  Assessment & Plan:   1. Essential hypertension - EKG 12-Lead-abnormal finding - CBC with Differential/Platelet - CMP14+EGFR - Ambulatory referral to Cardiology  2. Preoperative clearance ABNORMAL ECG-NEEDS CARDIO EVAL - CBC with Differential/Platelet - CMP14+EGFR - Ambulatory referral to Cardiology  3. ECG abnormality ABNORMAL ECG-NEEDS CARDIO EVAL - Ambulatory referral to Cardiology  4. Osteopenia, unspecified location - DG BONE DENSITY (DXA); Future  5. 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

## 2019-05-17 NOTE — Patient Instructions (Addendum)
     If you have lab work done today you will be contacted with your lab results within the next 2 weeks.  If you have not heard from [place] then please contact us. The fastest way to get your results is to register for My Chart. Cardiology appt to review abnormal ecg HOLD referral  Until cardio evaluation  IF you received an x-ray today, you will receive an invoice from [facility]. Please contact [facility] at [phone] with questions or concerns regarding your invoice.   IF you received labwork today, you will receive an invoice from [facility]. Please contact [facility] at [phone] with questions or concerns regarding your invoice.   Our billing staff will not be able to assist you with questions regarding bills from these companies.  You will be contacted with the lab results as soon as they are available. The fastest way to get your results is to activate your My Chart account. Instructions are located on the last page of this paperwork. If you have not heard from [place] regarding the results in 2 weeks, please contact this office.

## 2019-05-18 LAB — CMP14+EGFR
ALT: 30 IU/L (ref 0–32)
AST: 22 IU/L (ref 0–40)
Albumin/Globulin Ratio: 2.1 (ref 1.2–2.2)
Albumin: 4.8 g/dL (ref 3.8–4.8)
Alkaline Phosphatase: 81 IU/L (ref 39–117)
BUN/Creatinine Ratio: 16 (ref 12–28)
BUN: 11 mg/dL (ref 8–27)
Bilirubin Total: 0.7 mg/dL (ref 0.0–1.2)
CO2: 25 mmol/L (ref 20–29)
Calcium: 10.6 mg/dL — ABNORMAL HIGH (ref 8.7–10.3)
Chloride: 96 mmol/L (ref 96–106)
Creatinine, Ser: 0.7 mg/dL (ref 0.57–1.00)
GFR calc Af Amer: 102 mL/min/{1.73_m2} (ref >59)
GFR calc non Af Amer: 89 mL/min/{1.73_m2} (ref >59)
Globulin, Total: 2.3 g/dL (ref 1.5–4.5)
Glucose: 100 mg/dL — ABNORMAL HIGH (ref 65–99)
Potassium: 4.9 mmol/L (ref 3.5–5.2)
Sodium: 133 mmol/L — ABNORMAL LOW (ref 134–144)
Total Protein: 7.1 g/dL (ref 6.0–8.5)

## 2019-05-27 NOTE — Progress Notes (Signed)
Cardiology Office Note:    Date:  05/28/2019   ID:  Maria Bautista, DOB Apr 11, 1951, MRN [id number]  PCP:  Forrest Moron, MD  Cardiologist:  Shirlee More, MD   Referring MD: Maryruth Hancock, MD  ASSESSMENT:    1. Preoperative clearance   2. ECG abnormality   3. Essential hypertension    PLAN:    In order of problems listed above:  1. She has an EKG pattern commonly seen in normals but also can be seen in people with pre-existing anterior septal MI.  As opposed to indirect EKG I asked her to have a direct echocardiogram performed in the next few days my expectation is there is no evidence of coronary disease and at that point should be optimized and appropriate for planned surgery under general anesthesia.  I do not think she requires an ischemia evaluation she does not have hypertension or valvular heart disease. 2. EKG is seen more commonly in normal than abnormal needs further evaluation echocardiogram 3. Stable continue her diuretic BP at target  Next appointment   Medication Adjustments/Labs and Tests Ordered: Current medicines are reviewed at length with the patient today.  Concerns regarding medicines are outlined above.  No orders of the defined types were placed in this encounter.  No orders of the defined types were placed in this encounter.   Abnormal EKG   History of Present Illness:    Maria Bautista is a 68 y.o. female who is being seen today for the evaluation of an abnormal preop EKG for L TKA Dr Noemi Chapel  at the request of Maryruth Hancock, MD.  An EKG 05/17/2019 was abnormal with Q waves in V1 V2 consider preceding anterior septal MI.  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  She has no history of heart disease she has had previous EKGs but has been well over a decade and no history of congenital rheumatic heart disease.  She has had no edema  shortness of breath chest pain palpitation or syncope.  She does not have sleep apnea and history of venous thromboembolism or opportunistic resistant bacterial infection.  Past Medical History:  Diagnosis Date  . Allergy   . Arthritis     Past Surgical History:  Procedure Laterality Date  . ABDOMINAL HYSTERECTOMY  1982  . APPENDECTOMY  1982  . BREAST EXCISIONAL BIOPSY Left   . CESAREAN SECTION     x 2  . FRACTURE SURGERY    . SPINE SURGERY  1995    Current Medications: Current Meds  Medication Sig  . aspirin 81 MG tablet Take 81 mg by mouth daily.  . Biotin 1000 MCG tablet Take 1 tablet by mouth daily.  . cholecalciferol (VITAMIN D) 1000 UNITS tablet Take 1,000 Units by mouth daily.  . Cranberry 500 MG CAPS Take 500 mg by mouth daily.   . diclofenac (VOLTAREN) 75 MG EC tablet Take 75 mg by mouth 2 (two) times a day.  . ibuprofen (ADVIL) 200 MG tablet Take 200 mg by mouth 2 (two) times a day.  . Multiple Vitamins-Minerals (EMERGEN-C IMMUNE PLUS) PACK Take 1 tablet by mouth daily.  Marland Kitchen omeprazole (PRILOSEC) 10 MG capsule Take 10 mg by mouth daily.  Marland Kitchen spironolactone (ALDACTONE) 100 MG tablet TAKE 1 TABLET BY MOUTH  DAILY  . Turmeric 450 MG CAPS Take 1 capsule by mouth daily.  Allergies:   Codeine   Social History   Socioeconomic History  . Marital status: Divorced    Spouse name: Not on file  . Number of children: 2  . Years of education: Not on file  . Highest education level: Not on file  Occupational History  . 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 Financial resource strain: Not on file  . Food insecurity    Worry: Not on file    Inability: Not on file  . Transportation needs    Medical: Not on file    Non-medical: Not on file  Tobacco Use  . Smoking status: Never Smoker  . Smokeless tobacco: Never Used  Substance and Sexual Activity  . Alcohol use: No    Alcohol/week: 0.0 standard drinks  . Drug  use: No  . Sexual activity: Yes    Birth control/protection: Surgical  Lifestyle  . Physical activity    Days per week: Not on file    Minutes per session: Not on file  . Stress: Not on file  Relationships  . Social Herbalist on phone: Not on file    Gets together: Not on file    Attends religious service: Not on file    Active member of club or organization: Not on file    Attends meetings of clubs or organizations: Not on file    Relationship status: Not on file  Other Topics Concern  . Not on file  Social History Narrative   Lives alone.   Divorced 12/2016 from second husband following 38 years of marriage.   Daughters are adults and live independently.     Family History: The patient's family history includes Asthma in her father; Diabetes in her father; Heart disease in her mother; Hyperlipidemia in her mother; Kidney disease in her mother; Lung disease in her father.  ROS:   Review of Systems  Constitution: Negative.  HENT: Negative.   Eyes: Negative.   Cardiovascular: Negative.   Respiratory: Negative.   Endocrine: Negative.   Hematologic/Lymphatic: Negative.   Skin: Negative.   Musculoskeletal: Positive for back pain and joint pain.  Gastrointestinal: Negative.   Genitourinary: Negative.   Neurological: Negative.   Psychiatric/Behavioral: Negative.    Please see the history of present illness.     All other systems reviewed and are negative.  EKGs/Labs/Other Studies Reviewed:    The following studies were reviewed today:  Recent Labs: 01/26/2019: TSH 0.647 05/17/2019: ALT 30; BUN 11; Creatinine, Ser 0.70; Hemoglobin 12.6; Platelets 355; Potassium 4.9; Sodium 133  Recent Lipid Panel    Component Value Date/Time   CHOL 190 01/26/2019 1003   TRIG 85 01/26/2019 1003   HDL 55 01/26/2019 1003   CHOLHDL 3.5 01/26/2019 1003   CHOLHDL 3.7 02/17/2015 0821   VLDL 17 02/17/2015 0821   LDLCALC 118 (H) 01/26/2019 1003    Physical Exam:    VS:  BP  104/70   Pulse 68   Ht 5' 2.5" (1.588 m)   Wt 149 lb (67.6 kg)   BMI 26.82 kg/m     Wt Readings from Last 3 Encounters:  05/28/19 149 lb (67.6 kg)  05/17/19 152 lb (68.9 kg)  01/26/19 148 lb (67.1 kg)   Vital signs reviewed  Signed, Shirlee More, MD  05/28/2019 3:36 PM    [facility]

## 2019-05-28 ENCOUNTER — Other Ambulatory Visit: Payer: Self-pay

## 2019-05-28 ENCOUNTER — Telehealth (INDEPENDENT_AMBULATORY_CARE_PROVIDER_SITE_OTHER): Payer: Medicare Other | Admitting: Cardiology

## 2019-05-28 ENCOUNTER — Encounter: Payer: Self-pay | Admitting: Cardiology

## 2019-05-28 ENCOUNTER — Telehealth: Payer: Medicare Other | Admitting: Cardiology

## 2019-05-28 VITALS — BP 104/70 | HR 68 | Ht 62.5 in | Wt 149.0 lb

## 2019-05-28 DIAGNOSIS — I10 Essential (primary) hypertension: Secondary | ICD-10-CM

## 2019-05-28 DIAGNOSIS — Z01818 Encounter for other preprocedural examination: Secondary | ICD-10-CM

## 2019-05-28 DIAGNOSIS — R9431 Abnormal electrocardiogram [ECG] [EKG]: Secondary | ICD-10-CM | POA: Diagnosis not present

## 2019-05-28 NOTE — Patient Instructions (Addendum)
Medication Instructions:  Your physician recommends that you continue on your current medications as directed. Please refer to the Current Medication list given to you today.  If you need a refill on your cardiac medications before your next appointment, please call your pharmacy.   Lab work: None  If you have labs (blood work) drawn today and your tests are completely normal, you will receive your results only by: Marland Kitchen MyChart Message (if you have MyChart) OR . A paper copy in the mail If you have any lab test that is abnormal or we need to change your treatment, we will call you to review the results.  Testing/Procedures: Your physician has requested that you have an echocardiogram. Echocardiography is a painless test that uses sound waves to create images of your heart. It provides your doctor with information about the size and shape of your heart and how well your heart's chambers and valves are working. This procedure takes approximately one hour. There are no restrictions for this procedure. This     Follow-Up: At [facility], you and your health needs are our priority.  As part of our continuing mission to provide you with exceptional heart care, we have created designated Provider Care Teams.  These Care Teams include your primary Cardiologist (physician) and Advanced Practice Providers (APPs -  Physician Assistants and Nurse Practitioners) who all work together to provide you with the care you need, when you need it. You will need a follow up appointment as needed if symptoms worsen or fail to improve.     Echocardiogram An echocardiogram is a procedure that uses painless sound waves (ultrasound) to produce an image of the heart. Images from an echocardiogram can provide important information about:  Signs of coronary artery disease (CAD).  Aneurysm detection. An aneurysm is a weak or damaged part of an artery wall that bulges out from the normal force of blood pumping through  the body.  Heart size and shape. Changes in the size or shape of the heart can be associated with certain conditions, including heart failure, aneurysm, and CAD.  Heart muscle function.  Heart valve function.  Signs of a past heart attack.  Fluid buildup around the heart.  Thickening of the heart muscle.  A tumor or infectious growth around the heart valves. Tell a health care provider about:  Any allergies you have.  All medicines you are taking, including vitamins, herbs, eye drops, creams, and over-the-counter medicines.  Any blood disorders you have.  Any surgeries you have had.  Any medical conditions you have.  Whether you are pregnant or may be pregnant. What are the risks? Generally, this is a safe procedure. However, problems may occur, including:  Allergic reaction to dye (contrast) that may be used during the procedure. What happens before the procedure? No specific preparation is needed. You may eat and drink normally. What happens during the procedure?   An IV tube may be inserted into one of your veins.  You may receive contrast through this tube. A contrast is an injection that improves the quality of the pictures from your heart.  A gel will be applied to your chest.  A wand-like tool (transducer) will be moved over your chest. The gel will help to transmit the sound waves from the transducer.  The sound waves will harmlessly bounce off of your heart to allow the heart images to be captured in real-time motion. The images will be recorded on a computer. The procedure may vary among  health care providers and hospitals. What happens after the procedure?  You may return to your normal, everyday life, including diet, activities, and medicines, unless your health care provider tells you not to do that. Summary  An echocardiogram is a procedure that uses painless sound waves (ultrasound) to produce an image of the heart.  Images from an echocardiogram  can provide important information about the size and shape of your heart, heart muscle function, heart valve function, and fluid buildup around your heart.  You do not need to do anything to prepare before this procedure. You may eat and drink normally.  After the echocardiogram is completed, you may return to your normal, everyday life, unless your health care provider tells you not to do that. This information is not intended to replace advice given to you by your health care provider. Make sure you discuss any questions you have with your health care provider. Document Released: 11/05/2000 Document Revised: 03/01/2019 Document Reviewed: 12/11/2016 Elsevier Patient Education  2020 [employer].

## 2019-05-30 ENCOUNTER — Other Ambulatory Visit: Payer: Self-pay

## 2019-05-30 ENCOUNTER — Ambulatory Visit: Payer: Medicare Other | Admitting: Family Medicine

## 2019-05-30 ENCOUNTER — Ambulatory Visit (HOSPITAL_COMMUNITY): Payer: Medicare Other | Attending: Cardiovascular Disease

## 2019-05-30 DIAGNOSIS — I10 Essential (primary) hypertension: Secondary | ICD-10-CM | POA: Diagnosis not present

## 2019-05-30 DIAGNOSIS — Z01818 Encounter for other preprocedural examination: Secondary | ICD-10-CM | POA: Diagnosis not present

## 2019-05-30 DIAGNOSIS — R9431 Abnormal electrocardiogram [ECG] [EKG]: Secondary | ICD-10-CM | POA: Diagnosis not present

## 2019-05-30 NOTE — Progress Notes (Signed)
Cardiac echo done for preoperative evaluation with abnormal EKG is normal.  There is no evidence of coronary artery disease.  I would proceed with the planned orthopedic surgery without further cardiac evaluation in my opinion she is optimized for the planned procedure and anesthesia.

## 2019-05-31 ENCOUNTER — Telehealth: Payer: Self-pay

## 2019-05-31 NOTE — Telephone Encounter (Signed)
Patient called and notified of test results. 

## 2019-05-31 NOTE — Telephone Encounter (Signed)
-----   Message from Richardo Priest, MD sent at 05/30/2019  1:01 PM EDT ----- Normal or stable result  I sent a note to her orthopedic surgeon's office in my opinion she is optimized for surgery

## 2019-06-20 ENCOUNTER — Encounter: Payer: Self-pay | Admitting: Physician Assistant

## 2019-06-20 DIAGNOSIS — M1712 Unilateral primary osteoarthritis, left knee: Secondary | ICD-10-CM | POA: Diagnosis not present

## 2019-06-20 HISTORY — DX: Unilateral primary osteoarthritis, left knee: M17.12

## 2019-06-20 NOTE — H&P (Addendum)
TOTAL KNEE ADMISSION H&P  Patient is being admitted for left total knee arthroplasty.  Subjective:  Chief Complaint:left knee pain.  HPI: Maria Bautista, 68 y.o. female, has a history of pain and functional disability in the left knee due to arthritis and has failed non-surgical conservative treatments for greater than 12 weeks to includeNSAID's and/or analgesics, corticosteriod injections, viscosupplementation injections, flexibility and strengthening excercises, supervised PT with diminished ADL's post treatment, weight reduction as appropriate and activity modification.  Onset of symptoms was gradual, starting 10 years ago with gradually worsening course since that time. The patient noted no past surgery on the left knee(s).  Patient currently rates pain in the left knee(s) at 10 out of 10 with activity. Patient has night pain, worsening of pain with activity and weight bearing, pain that interferes with activities of daily living, crepitus and joint swelling.  Patient has evidence of subchondral sclerosis, periarticular osteophytes and joint space narrowing by imaging studies. There is no active infection.  Patient Active Problem List   Diagnosis Date Noted  . Osteopenia 05/17/2019  . Preoperative clearance 05/17/2019  . Essential hypertension 05/17/2019  . ECG abnormality 05/17/2019  . Osteoarthritis 08/28/2015  . Thinning hair 02/17/2015  . Borderline hyperglycemia 02/17/2015   Past Medical History:  Diagnosis Date  . Allergy   . Arthritis     Past Surgical History:  Procedure Laterality Date  . ABDOMINAL HYSTERECTOMY  1982  . APPENDECTOMY  1982  . BREAST EXCISIONAL BIOPSY Left   . CESAREAN SECTION     x 2  . FRACTURE SURGERY    . SPINE SURGERY  1995    No current facility-administered medications for this encounter.    Current Outpatient Medications  Medication Sig Dispense Refill Last Dose  . aspirin 81 MG tablet Take 81 mg by mouth daily.   06/20/2019 at Unknown time  .  Biotin 1000 MCG tablet Take 1,000 mcg by mouth daily.    06/20/2019 at Unknown time  . Cholecalciferol (VITAMIN D) 50 MCG (2000 UT) tablet Take 2,000 Units by mouth daily.    06/20/2019 at Unknown time  . Cranberry 500 MG CAPS Take 500 mg by mouth daily.    06/19/2019 at Unknown time  . diphenhydrAMINE (BENADRYL) 25 MG tablet Take 25 mg by mouth at bedtime.   06/20/2019 at Unknown time  . Multiple Vitamins-Minerals (EMERGEN-C IMMUNE PLUS) PACK Take 1 packet by mouth daily.    06/20/2019 at Unknown time  . omeprazole (PRILOSEC) 10 MG capsule Take 10 mg by mouth daily.   06/20/2019 at Unknown time  . spironolactone (ALDACTONE) 100 MG tablet TAKE 1 TABLET BY MOUTH  DAILY (Patient taking differently: Take 100 mg by mouth daily. ) 90 tablet 0 06/20/2019 at Unknown time  . vitamin B-12 (CYANOCOBALAMIN) 1000 MCG tablet Take 1,000 mcg by mouth daily.   06/19/2019 at Unknown time  . ibuprofen (ADVIL) 200 MG tablet Take 200 mg by mouth 2 (two) times a day.   More than a month at Unknown time  . Turmeric 450 MG CAPS Take 450 mg by mouth daily.    More than a month at Unknown time   Allergies  Allergen Reactions  . Codeine Nausea And Vomiting    Social History   Tobacco Use  . Smoking status: Never Smoker  . Smokeless tobacco: Never Used  Substance Use Topics  . Alcohol use: No    Alcohol/week: 0.0 standard drinks    Family History  Problem Relation Age of Onset  .  Heart disease Mother   . Hyperlipidemia Mother   . Kidney disease Mother   . Diabetes Father   . Asthma Father   . Lung disease Father      Review of Systems  Constitutional: Negative.   HENT: Negative.   Eyes: Negative.   Respiratory: Negative.   Cardiovascular: Negative.   Gastrointestinal: Negative.   Musculoskeletal: Positive for joint pain.  Skin: Negative.   Neurological: Negative.   Endo/Heme/Allergies: Negative.   Psychiatric/Behavioral: Negative.     Objective:  Physical Exam  Constitutional: She is oriented to  person, place, and time. She appears well-developed and well-nourished.  HENT:  Head: Normocephalic and atraumatic.  Mouth/Throat: Oropharynx is clear and moist.  Eyes: Pupils are equal, round, and reactive to light. Conjunctivae are normal.  Neck: Neck supple.  Cardiovascular: Normal rate and regular rhythm.  Respiratory: Effort normal and breath sounds normal.  GI: Soft. Bowel sounds are normal.  Genitourinary:    Genitourinary Comments: Not pertinent to current symptomatology therefore not examined.   Musculoskeletal:     Comments: Examination of her left knee reveals pain medially and laterally.  1+ crepitation.  1+ synovitis.  Mild varus deformity.  Range of motion 0-120 degrees.  Knee is stable with normal patella tracking.  Examination of the right knee reveals full range of motion without pain, swelling, weakness or instability.  Vascular exam: Pulses are 2+ and symmetric.  Neurologic exam: Distal motor and sensory examination is within normal limits.    Neurological: She is alert and oriented to person, place, and time.  Skin: Skin is warm and dry.  Psychiatric: She has a normal mood and affect. Her behavior is normal.    Vital signs in last 24 hours: Temp:  [97.9 F (36.6 C)] 97.9 F (36.6 C) (07/29 1600) Pulse Rate:  [94] 94 (07/29 1600) BP: (143)/(82) 143/82 (07/29 1600) SpO2:  [96 %] 96 % (07/29 1600) Weight:  [69.4 kg] 69.4 kg (07/29 1600)  Labs:   Estimated body mass index is 27.98 kg/m as calculated from the following:   Height as of this encounter: 5\' 2"  (1.575 m).   Weight as of this encounter: 69.4 kg.   Imaging Review Plain radiographs demonstrate severe degenerative joint disease of the left knee(s). The overall alignment ismild varus. The bone quality appears to be good for age and reported activity level.X-rays are reviewed from January 30, 2019 of her left knee that show significant end stage osteoarthritis with mild varus deformity.         Assessment/Plan:  End stage arthritis, left knee   The patient history, physical examination, clinical judgment of the provider and imaging studies are consistent with end stage degenerative joint disease of the left knee(s) and total knee arthroplasty is deemed medically necessary. The treatment options including medical management, injection therapy arthroscopy and arthroplasty were discussed at length. The risks and benefits of total knee arthroplasty were presented and reviewed. The risks due to aseptic loosening, infection, stiffness, patella tracking problems, thromboembolic complications and other imponderables were discussed. The patient acknowledged the explanation, agreed to proceed with the plan and consent was signed. Patient is being admitted for inpatient treatment for surgery, pain control, PT, OT, prophylactic antibiotics, VTE prophylaxis, progressive ambulation and ADL's and discharge planning. The patient is planning to be discharged home with home health services     Patient's anticipated LOS is less than 2 midnights, meeting these requirements: - Younger than 34 - Lives within 1 hour of care - Has  a competent adult at home to recover with post-op recover - NO history of  - Chronic pain requiring opiods  - Diabetes  - Coronary Artery Disease  - Heart failure  - Heart attack  - Stroke  - DVT/VTE  - Cardiac arrhythmia  - Respiratory Failure/COPD  - Renal failure  - Anemia  - Advanced Liver disease

## 2019-06-21 ENCOUNTER — Encounter (HOSPITAL_COMMUNITY): Payer: Self-pay

## 2019-06-21 NOTE — Progress Notes (Signed)
The following are in epic: Cardiac clearance Dr. Bettina Gavia 05/28/2019 Medical clearance Dr. Holly Bodily 05/17/2019 EKG 05/17/2019 ECHO 05/30/2019

## 2019-06-21 NOTE — Patient Instructions (Addendum)
DUE TO COVID-19 ONLY ONE VISITOR IS ALLOWED IN THE WAITING ROOM ONLY AT THIS TIME   COVID SWAB TESTING MUST BE COMPLETED ON: Thursday, Aug. 6, 2020 at 9:55AM   [address], [place] -[facility] enter pre surgical testing line (Must self quarantine after testing. Follow instructions on handout.)             Your procedure is scheduled on: Tuesday, Aug. 10, 2020   Report to [facility] Main  Entrance    Report to admitting at 7:15 AM   Call this number if you have problems the morning of surgery [phone]   Do not eat food:After Midnight.   May have liquids until 6:45AM day of surgery   CLEAR LIQUID DIET  Foods Allowed                                                                     Foods Excluded  Water, Black Coffee and tea, regular and decaf                             liquids that you cannot  Plain Jell-O in any flavor  (No red)                                           see through such as: Fruit ices (not with fruit pulp)                                     milk, soups, orange juice  Iced Popsicles (No red)                                    All solid food Carbonated beverages, regular and diet                                    Apple juices Sports drinks like Gatorade (No red) Lightly seasoned clear broth or consume(fat free) Sugar, honey syrup  Sample Menu Breakfast                                Lunch                                     Supper Cranberry juice                    Beef broth                            Chicken broth Jell-O  Grape juice                           Apple juice Coffee or tea                        Jell-O                                      Popsicle                                                Coffee or tea                        Coffee or tea   Complete one Ensure drink the morning of surgery at 6:45AM the day of surgery.   Brush your teeth the morning of  surgery.   Do NOT smoke after Midnight   Take these medicines the morning of surgery with A SIP OF WATER: Prilosec                               You may not have any metal on your body including hair pins, jewelry, and body piercings             Do not wear make-up, lotions, powders, perfumes/cologne, or deodorant             Do not wear nail polish.  Do not shave  48 hours prior to surgery.                Do not bring valuables to the hospital. [place].   Contacts, dentures or bridgework may not be worn into surgery.   Bring small overnight bag day of surgery.    Special Instructions: Bring a copy of your healthcare power of attorney and living will documents         the day of surgery if you haven't scanned them in before.              Please read over the following fact sheets you were given:  [facility] - Preparing for Surgery Before surgery, you can play an important role.  Because skin is not sterile, your skin needs to be as free of germs as possible.  You can reduce the number of germs on your skin by washing with CHG (chlorahexidine gluconate) soap before surgery.  CHG is an antiseptic cleaner which kills germs and bonds with the skin to continue killing germs even after washing. Please DO NOT use if you have an allergy to CHG or antibacterial soaps.  If your skin becomes reddened/irritated stop using the CHG and inform your nurse when you arrive at Short Stay. Do not shave (including legs and underarms) for at least 48 hours prior to the first CHG shower.  You may shave your face/neck.  Please follow these instructions carefully:  1.  Shower with CHG Soap the night before surgery and the  morning of surgery.  2.  If you choose to wash your  hair, wash your hair first as usual with your normal  shampoo.  3.  After you shampoo, rinse your hair and body thoroughly to remove the shampoo.                             4.  Use CHG as  you would any other liquid soap.  You can apply chg directly to the skin and wash.  Gently with a scrungie or clean washcloth.  5.  Apply the CHG Soap to your body ONLY FROM THE NECK DOWN.   Do   not use on face/ open                           Wound or open sores. Avoid contact with eyes, ears mouth and   genitals (private parts).                       Wash face,  Genitals (private parts) with your normal soap.             6.  Wash thoroughly, paying special attention to the area where your    surgery  will be performed.  7.  Thoroughly rinse your body with warm water from the neck down.  8.  DO NOT shower/wash with your normal soap after using and rinsing off the CHG Soap.                9.  Pat yourself dry with a clean towel.            10.  Wear clean pajamas.            11.  Place clean sheets on your bed the night of your first shower and do not  sleep with pets. Day of Surgery : Do not apply any lotions/deodorants the morning of surgery.  Please wear clean clothes to the hospital/surgery center.  FAILURE TO FOLLOW THESE INSTRUCTIONS MAY RESULT IN THE CANCELLATION OF YOUR SURGERY  PATIENT SIGNATURE_________________________________  NURSE SIGNATURE__________________________________  ________________________________________________________________________   Maria Bautista  An incentive spirometer is a tool that can help keep your lungs clear and active. This tool measures how well you are filling your lungs with each breath. Taking long deep breaths may help reverse or decrease the chance of developing breathing (pulmonary) problems (especially infection) following:  A long period of time when you are unable to move or be active. BEFORE THE PROCEDURE   If the spirometer includes an indicator to show your best effort, your nurse or respiratory therapist will set it to a desired goal.  If possible, sit up straight or lean slightly forward. Try not to slouch.  Hold the  incentive spirometer in an upright position. INSTRUCTIONS FOR USE  1. Sit on the edge of your bed if possible, or sit up as far as you can in bed or on a chair. 2. Hold the incentive spirometer in an upright position. 3. Breathe out normally. 4. Place the mouthpiece in your mouth and seal your lips tightly around it. 5. Breathe in slowly and as deeply as possible, raising the piston or the ball toward the top of the column. 6. Hold your breath for 3-5 seconds or for as long as possible. Allow the piston or ball to fall to the bottom of the column. 7. Remove the mouthpiece from your mouth and breathe out normally. 8. Rest  for a few seconds and repeat Steps 1 through 7 at least 10 times every 1-2 hours when you are awake. Take your time and take a few normal breaths between deep breaths. 9. The spirometer may include an indicator to show your best effort. Use the indicator as a goal to work toward during each repetition. 10. After each set of 10 deep breaths, practice coughing to be sure your lungs are clear. If you have an incision (the cut made at the time of surgery), support your incision when coughing by placing a pillow or rolled up towels firmly against it. Once you are able to get out of bed, walk around indoors and cough well. You may stop using the incentive spirometer when instructed by your caregiver.  RISKS AND COMPLICATIONS  Take your time so you do not get dizzy or light-headed.  If you are in pain, you may need to take or ask for pain medication before doing incentive spirometry. It is harder to take a deep breath if you are having pain. AFTER USE  Rest and breathe slowly and easily.  It can be helpful to keep track of a log of your progress. Your caregiver can provide you with a simple table to help with this. If you are using the spirometer at home, follow these instructions: [place] IF:   You are having difficultly using the spirometer.  You have trouble using  the spirometer as often as instructed.  Your pain medication is not giving enough relief while using the spirometer.  You develop fever of 100.5 F (38.1 C) or higher. SEEK IMMEDIATE MEDICAL CARE IF:   You cough up bloody sputum that had not been present before.  You develop fever of 102 F (38.9 C) or greater.  You develop worsening pain at or near the incision site. MAKE SURE YOU:   Understand these instructions.  Will watch your condition.  Will get help right away if you are not doing well or get worse. Document Released: 03/21/2007 Document Revised: 01/31/2012 Document Reviewed: 05/22/2007 [id number] Patient Information 2014 [place], [place].   ________________________________________________________________________

## 2019-06-22 ENCOUNTER — Encounter (HOSPITAL_COMMUNITY): Payer: Self-pay

## 2019-06-22 ENCOUNTER — Other Ambulatory Visit: Payer: Self-pay

## 2019-06-22 ENCOUNTER — Encounter (HOSPITAL_COMMUNITY)
Admission: RE | Admit: 2019-06-22 | Discharge: 2019-06-22 | Disposition: A | Discharge status: Home / Self Care | Payer: Medicare Other | Source: Ambulatory Visit | Attending: Orthopedic Surgery | Admitting: Orthopedic Surgery

## 2019-06-22 DIAGNOSIS — M25562 Pain in left knee: Secondary | ICD-10-CM | POA: Insufficient documentation

## 2019-06-22 DIAGNOSIS — M1712 Unilateral primary osteoarthritis, left knee: Secondary | ICD-10-CM | POA: Diagnosis not present

## 2019-06-22 DIAGNOSIS — M1990 Unspecified osteoarthritis, unspecified site: Secondary | ICD-10-CM | POA: Diagnosis not present

## 2019-06-22 DIAGNOSIS — K219 Gastro-esophageal reflux disease without esophagitis: Secondary | ICD-10-CM | POA: Diagnosis not present

## 2019-06-22 DIAGNOSIS — E049 Nontoxic goiter, unspecified: Secondary | ICD-10-CM | POA: Insufficient documentation

## 2019-06-22 DIAGNOSIS — Z79899 Other long term (current) drug therapy: Secondary | ICD-10-CM | POA: Insufficient documentation

## 2019-06-22 DIAGNOSIS — Z01812 Encounter for preprocedural laboratory examination: Secondary | ICD-10-CM | POA: Diagnosis not present

## 2019-06-22 DIAGNOSIS — Z7982 Long term (current) use of aspirin: Secondary | ICD-10-CM | POA: Diagnosis not present

## 2019-06-22 DIAGNOSIS — E039 Hypothyroidism, unspecified: Secondary | ICD-10-CM | POA: Insufficient documentation

## 2019-06-22 HISTORY — DX: Dysphagia, unspecified: R13.10

## 2019-06-22 HISTORY — DX: Gastro-esophageal reflux disease without esophagitis: K21.9

## 2019-06-22 HISTORY — DX: Unspecified cataract: H26.9

## 2019-06-22 HISTORY — DX: Nontoxic goiter, unspecified: E04.9

## 2019-06-22 HISTORY — DX: Personal history of urinary calculi: Z87.442

## 2019-06-22 LAB — COMPREHENSIVE METABOLIC PANEL
ALT: 23 U/L (ref 0–44)
AST: 22 U/L (ref 15–41)
Albumin: 4.9 g/dL (ref 3.5–5.0)
Alkaline Phosphatase: 66 U/L (ref 38–126)
Anion gap: 7 (ref 5–15)
BUN: 11 mg/dL (ref 8–23)
CO2: 27 mmol/L (ref 22–32)
Calcium: 10.6 mg/dL — ABNORMAL HIGH (ref 8.9–10.3)
Chloride: 99 mmol/L (ref 98–111)
Creatinine, Ser: 0.61 mg/dL (ref 0.44–1.00)
GFR calc Af Amer: >60 mL/min (ref >60)
GFR calc non Af Amer: >60 mL/min (ref >60)
Glucose, Bld: 101 mg/dL — ABNORMAL HIGH (ref 70–99)
Potassium: 4.2 mmol/L (ref 3.5–5.1)
Sodium: 133 mmol/L — ABNORMAL LOW (ref 135–145)
Total Bilirubin: 1.2 mg/dL (ref 0.3–1.2)
Total Protein: 7.8 g/dL (ref 6.5–8.1)

## 2019-06-22 LAB — CBC WITH DIFFERENTIAL/PLATELET
Abs Immature Granulocytes: 0.02 10*3/uL (ref 0.00–0.07)
Basophils Absolute: 0 10*3/uL (ref 0.0–0.1)
Basophils Relative: 1 %
Eosinophils Absolute: 0.1 10*3/uL (ref 0.0–0.5)
Eosinophils Relative: 1 %
HCT: 38.8 % (ref 36.0–46.0)
Hemoglobin: 12.7 g/dL (ref 12.0–15.0)
Immature Granulocytes: 0 %
Lymphocytes Relative: 35 %
Lymphs Abs: 1.9 10*3/uL (ref 0.7–4.0)
MCH: 29.8 pg (ref 26.0–34.0)
MCHC: 32.7 g/dL (ref 30.0–36.0)
MCV: 91.1 fL (ref 80.0–100.0)
Monocytes Absolute: 0.5 10*3/uL (ref 0.1–1.0)
Monocytes Relative: 9 %
Neutro Abs: 3 10*3/uL (ref 1.7–7.7)
Neutrophils Relative %: 54 %
Platelets: 322 10*3/uL (ref 150–400)
RBC: 4.26 MIL/uL (ref 3.87–5.11)
RDW: 11.9 % (ref 11.5–15.5)
WBC: 5.5 10*3/uL (ref 4.0–10.5)
nRBC: 0 % (ref 0.0–0.2)

## 2019-06-22 LAB — APTT: aPTT: 27 seconds (ref 24–36)

## 2019-06-22 LAB — SURGICAL PCR SCREEN
MRSA, PCR: NEGATIVE
Staphylococcus aureus: NEGATIVE

## 2019-06-22 LAB — PROTIME-INR
INR: 1 (ref 0.8–1.2)
Prothrombin Time: 12.5 seconds (ref 11.4–15.2)

## 2019-06-22 NOTE — Progress Notes (Signed)
SPOKE W/  Maria Bautista     SCREENING SYMPTOMS OF COVID 19:   COUGH--NO  RUNNY NOSE--- NO  SORE THROAT---NO  NASAL CONGESTION----NO  SNEEZING----NO  SHORTNESS OF BREATH---NO  DIFFICULTY BREATHING---NO  TEMP >100.0 -----NO  UNEXPLAINED BODY ACHES------NO  CHILLS -------- NO  HEADACHES ---------NO  LOSS OF SMELL/ TASTE --------NO    HAVE YOU OR ANY FAMILY MEMBER TRAVELLED PAST 14 DAYS OUT OF THE   COUNTY---NO STATE----NO COUNTRY----NO  HAVE YOU OR ANY FAMILY MEMBER BEEN EXPOSED TO ANYONE WITH COVID 19? NO

## 2019-06-23 LAB — URINE CULTURE

## 2019-06-25 NOTE — Anesthesia Preprocedure Evaluation (Addendum)
Anesthesia Evaluation  Patient identified by MRN, date of birth, ID band Patient awake    Reviewed: Allergy & Precautions, NPO status , Patient's Chart, lab work & pertinent test results  Airway Mallampati: II  TM Distance: >3 FB Neck ROM: Full    Dental no notable dental hx. (+) Teeth Intact   Pulmonary neg pulmonary ROS,    Pulmonary exam normal breath sounds clear to auscultation       Cardiovascular negative cardio ROS Normal cardiovascular exam Rhythm:Regular Rate:Normal     Neuro/Psych negative neurological ROS  negative psych ROS   GI/Hepatic Neg liver ROS, GERD  Medicated and Controlled,  Endo/Other  Hyperthyroidism   Renal/GU negative Renal ROS  negative genitourinary   Musculoskeletal  (+) Arthritis , Osteoarthritis,  DJD left knee   Abdominal   Peds  Hematology   Anesthesia Other Findings   Reproductive/Obstetrics                            Anesthesia Physical Anesthesia Plan  ASA: II  Anesthesia Plan: Spinal   Post-op Pain Management:  [facility]ional for Post-op pain   Induction: Intravenous  PONV Risk Score and Plan: 3 and Ondansetron, Treatment may vary due to age or medical condition, Dexamethasone and Propofol infusion  Airway Management Planned: Natural Airway and Simple Face Mask  Additional Equipment:   Intra-op Plan:   Post-operative Plan:   Informed Consent: I have reviewed the patients History and Physical, chart, labs and discussed the procedure including the risks, benefits and alternatives for the proposed anesthesia with the patient or authorized representative who has indicated his/her understanding and acceptance.     Dental advisory given  Plan Discussed with: CRNA and Surgeon  Anesthesia Plan Comments: (See PAT note 06/22/2019, Konrad Felix, PA-C)      Anesthesia Quick Evaluation
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
## 2019-06-27 ENCOUNTER — Other Ambulatory Visit: Payer: Self-pay | Admitting: Family Medicine

## 2019-06-27 DIAGNOSIS — L659 Nonscarring hair loss, unspecified: Secondary | ICD-10-CM

## 2019-06-27 NOTE — Telephone Encounter (Signed)
Requested Prescriptions  Pending Prescriptions Disp Refills  . spironolactone (ALDACTONE) 100 MG tablet [Pharmacy Med Name: SPIRONOLACTONE  100MG   TAB] 90 tablet 0    Sig: TAKE 1 TABLET BY MOUTH  DAILY     Cardiovascular: Diuretics - Aldosterone Antagonist Failed - 06/27/2019  7:22 AM      Failed - Na in normal range and within 360 days    Sodium  Date Value Ref Range Status  06/22/2019 133 (L) 135 - 145 mmol/L Final  05/17/2019 133 (L) 134 - 144 mmol/L Final         Passed - Cr in normal range and within 360 days    Creat  Date Value Ref Range Status  04/16/2016 0.71 0.50 - 0.99 mg/dL Final   Creatinine, Ser  Date Value Ref Range Status  06/22/2019 0.61 0.44 - 1.00 mg/dL Final         Passed - K in normal range and within 360 days    Potassium  Date Value Ref Range Status  06/22/2019 4.2 3.5 - 5.1 mmol/L Final         Passed - Last BP in normal range    BP Readings from Last 1 Encounters:  06/22/19 126/70         Passed - Valid encounter within last 6 months    Recent Outpatient Visits          1 month ago Essential hypertension   [facility] Care at [facility], Rex Kras, MD   5 months ago Encounter for Medicare annual wellness exam   Primary Care at [facility], [place] A, MD   5 months ago Pes anserine bursitis   [facility] at [facility], Lilia Argue, MD   8 months ago Acute viral sinusitis   Primary Care at [facility], Gelene Mink, PA-C   1 year ago Hair loss   Primary Care at [facility], [place], [place]

## 2019-06-28 ENCOUNTER — Other Ambulatory Visit (HOSPITAL_COMMUNITY)
Admission: RE | Admit: 2019-06-28 | Discharge: 2019-06-28 | Disposition: A | Discharge status: Home / Self Care | Payer: Medicare Other | Source: Ambulatory Visit | Attending: Orthopedic Surgery | Admitting: Orthopedic Surgery

## 2019-06-28 DIAGNOSIS — Z01812 Encounter for preprocedural laboratory examination: Secondary | ICD-10-CM | POA: Diagnosis not present

## 2019-06-28 DIAGNOSIS — Z20828 Contact with and (suspected) exposure to other viral communicable diseases: Secondary | ICD-10-CM | POA: Diagnosis not present

## 2019-06-28 LAB — SARS CORONAVIRUS 2 (TAT 6-24 HRS): SARS Coronavirus 2: NEGATIVE

## 2019-06-29 ENCOUNTER — Encounter (HOSPITAL_COMMUNITY): Payer: Self-pay | Admitting: Anesthesiology

## 2019-07-01 MED ORDER — BUPIVACAINE LIPOSOME 1.3 % IJ SUSP
20.0000 mL | INTRAMUSCULAR | Status: DC
  Filled 2019-07-01: qty 20

## 2019-07-02 ENCOUNTER — Inpatient Hospital Stay (HOSPITAL_COMMUNITY)
Admission: AD | Admit: 2019-07-02 | Discharge: 2019-07-04 | DRG: 470 | Disposition: A | Discharge status: Home Health | Payer: Medicare Other | Attending: Orthopedic Surgery | Admitting: Orthopedic Surgery

## 2019-07-02 ENCOUNTER — Other Ambulatory Visit: Payer: Self-pay

## 2019-07-02 ENCOUNTER — Ambulatory Visit (HOSPITAL_COMMUNITY): Payer: Medicare Other | Admitting: Physician Assistant

## 2019-07-02 ENCOUNTER — Encounter (HOSPITAL_COMMUNITY): Payer: Self-pay | Admitting: Anesthesiology

## 2019-07-02 ENCOUNTER — Encounter (HOSPITAL_COMMUNITY)
Admission: AD | Disposition: A | Discharge status: Home Health | Payer: Self-pay | Source: Home / Self Care | Attending: Orthopedic Surgery

## 2019-07-02 ENCOUNTER — Ambulatory Visit (HOSPITAL_COMMUNITY): Payer: Medicare Other | Admitting: Anesthesiology

## 2019-07-02 DIAGNOSIS — Z825 Family history of asthma and other chronic lower respiratory diseases: Secondary | ICD-10-CM

## 2019-07-02 DIAGNOSIS — Z841 Family history of disorders of kidney and ureter: Secondary | ICD-10-CM

## 2019-07-02 DIAGNOSIS — Z833 Family history of diabetes mellitus: Secondary | ICD-10-CM

## 2019-07-02 DIAGNOSIS — Z8249 Family history of ischemic heart disease and other diseases of the circulatory system: Secondary | ICD-10-CM

## 2019-07-02 DIAGNOSIS — Z8349 Family history of other endocrine, nutritional and metabolic diseases: Secondary | ICD-10-CM | POA: Diagnosis not present

## 2019-07-02 DIAGNOSIS — G8918 Other acute postprocedural pain: Secondary | ICD-10-CM | POA: Diagnosis not present

## 2019-07-02 DIAGNOSIS — I10 Essential (primary) hypertension: Secondary | ICD-10-CM | POA: Diagnosis not present

## 2019-07-02 DIAGNOSIS — Z7982 Long term (current) use of aspirin: Secondary | ICD-10-CM | POA: Diagnosis not present

## 2019-07-02 DIAGNOSIS — D62 Acute posthemorrhagic anemia: Secondary | ICD-10-CM | POA: Diagnosis not present

## 2019-07-02 DIAGNOSIS — K219 Gastro-esophageal reflux disease without esophagitis: Secondary | ICD-10-CM | POA: Diagnosis not present

## 2019-07-02 DIAGNOSIS — Z885 Allergy status to narcotic agent status: Secondary | ICD-10-CM

## 2019-07-02 DIAGNOSIS — R55 Syncope and collapse: Secondary | ICD-10-CM | POA: Diagnosis not present

## 2019-07-02 DIAGNOSIS — E0590 Thyrotoxicosis, unspecified without thyrotoxic crisis or storm: Secondary | ICD-10-CM | POA: Diagnosis not present

## 2019-07-02 DIAGNOSIS — M1712 Unilateral primary osteoarthritis, left knee: Secondary | ICD-10-CM | POA: Diagnosis not present

## 2019-07-02 DIAGNOSIS — Z887 Allergy status to serum and vaccine status: Secondary | ICD-10-CM | POA: Diagnosis not present

## 2019-07-02 DIAGNOSIS — M8580 Other specified disorders of bone density and structure, unspecified site: Secondary | ICD-10-CM | POA: Diagnosis not present

## 2019-07-02 DIAGNOSIS — E049 Nontoxic goiter, unspecified: Secondary | ICD-10-CM | POA: Diagnosis present

## 2019-07-02 DIAGNOSIS — R739 Hyperglycemia, unspecified: Secondary | ICD-10-CM | POA: Diagnosis present

## 2019-07-02 HISTORY — PX: TOTAL KNEE ARTHROPLASTY: SHX125

## 2019-07-02 SURGERY — ARTHROPLASTY, KNEE, TOTAL
Anesthesia: Spinal | Site: Knee | Laterality: Left

## 2019-07-02 MED ORDER — ONDANSETRON HCL 4 MG PO TABS: 4.0000 mg | ORAL_TABLET | Freq: Four times a day (QID) | ORAL | Status: DC | PRN

## 2019-07-02 MED ORDER — FENTANYL CITRATE (PF) 100 MCG/2ML IJ SOLN: 25.0000 ug | INTRAMUSCULAR | Status: DC | PRN

## 2019-07-02 MED ORDER — PROPOFOL 500 MG/50ML IV EMUL
INTRAVENOUS | Status: DC | PRN
  Administered 2019-07-02: 75 ug/kg/min via INTRAVENOUS

## 2019-07-02 MED ORDER — DOCUSATE SODIUM 100 MG PO CAPS
100.0000 mg | ORAL_CAPSULE | Freq: Two times a day (BID) | ORAL | Status: DC
  Administered 2019-07-02 – 2019-07-04 (×4): 100 mg via ORAL
  Filled 2019-07-02 (×4): qty 1

## 2019-07-02 MED ORDER — DEXAMETHASONE SODIUM PHOSPHATE 10 MG/ML IJ SOLN
8.0000 mg | Freq: Once | INTRAMUSCULAR | Status: AC
  Administered 2019-07-02: 8 mg via INTRAVENOUS

## 2019-07-02 MED ORDER — ALPRAZOLAM 0.25 MG PO TABS
0.2500 mg | ORAL_TABLET | Freq: Three times a day (TID) | ORAL | Status: DC | PRN
  Administered 2019-07-03: 0.25 mg via ORAL
  Filled 2019-07-02: qty 1

## 2019-07-02 MED ORDER — SODIUM CHLORIDE (PF) 0.9 % IJ SOLN
INTRAMUSCULAR | Status: AC
  Filled 2019-07-02: qty 50

## 2019-07-02 MED ORDER — WATER FOR IRRIGATION, STERILE IR SOLN
Status: DC | PRN
  Administered 2019-07-02: 2000 mL

## 2019-07-02 MED ORDER — ONDANSETRON HCL 4 MG/2ML IJ SOLN: 4.0000 mg | Freq: Four times a day (QID) | INTRAMUSCULAR | Status: DC | PRN

## 2019-07-02 MED ORDER — POTASSIUM CHLORIDE IN NACL 20-0.9 MEQ/L-% IV SOLN
INTRAVENOUS | Status: DC
  Administered 2019-07-02 – 2019-07-03 (×2): via INTRAVENOUS
  Filled 2019-07-02 (×3): qty 1000

## 2019-07-02 MED ORDER — FENTANYL CITRATE (PF) 100 MCG/2ML IJ SOLN
50.0000 ug | INTRAMUSCULAR | Status: DC
  Administered 2019-07-02: 50 ug via INTRAVENOUS
  Filled 2019-07-02: qty 2

## 2019-07-02 MED ORDER — LACTATED RINGERS IV SOLN: INTRAVENOUS | Status: DC

## 2019-07-02 MED ORDER — GABAPENTIN 300 MG PO CAPS
300.0000 mg | ORAL_CAPSULE | Freq: Every day | ORAL | Status: DC
  Administered 2019-07-02 – 2019-07-03 (×2): 300 mg via ORAL
  Filled 2019-07-02 (×2): qty 1

## 2019-07-02 MED ORDER — ONDANSETRON HCL 4 MG/2ML IJ SOLN: 4.0000 mg | Freq: Once | INTRAMUSCULAR | Status: DC | PRN

## 2019-07-02 MED ORDER — BUPIVACAINE-EPINEPHRINE 0.25% -1:200000 IJ SOLN
INTRAMUSCULAR | Status: DC | PRN
  Administered 2019-07-02: 30 mL

## 2019-07-02 MED ORDER — ASPIRIN EC 325 MG PO TBEC
325.0000 mg | DELAYED_RELEASE_TABLET | Freq: Every day | ORAL | Status: DC
  Administered 2019-07-03 – 2019-07-04 (×2): 325 mg via ORAL
  Filled 2019-07-02 (×2): qty 1

## 2019-07-02 MED ORDER — DIPHENHYDRAMINE HCL 12.5 MG/5ML PO ELIX: 12.5000 mg | ORAL_SOLUTION | ORAL | Status: DC | PRN

## 2019-07-02 MED ORDER — ONDANSETRON HCL 4 MG/2ML IJ SOLN
INTRAMUSCULAR | Status: AC
  Filled 2019-07-02: qty 2

## 2019-07-02 MED ORDER — OXYCODONE HCL 5 MG PO TABS
5.0000 mg | ORAL_TABLET | ORAL | Status: DC | PRN
  Administered 2019-07-02: 10 mg via ORAL
  Administered 2019-07-02: 5 mg via ORAL
  Administered 2019-07-03: 10 mg via ORAL
  Administered 2019-07-03: 5 mg via ORAL
  Administered 2019-07-04: 10 mg via ORAL
  Filled 2019-07-02: qty 2
  Filled 2019-07-02: qty 1
  Filled 2019-07-02 (×3): qty 2

## 2019-07-02 MED ORDER — PROPOFOL 500 MG/50ML IV EMUL
INTRAVENOUS | Status: DC | PRN
  Administered 2019-07-02: 20 mg via INTRAVENOUS
  Administered 2019-07-02: 30 mg via INTRAVENOUS

## 2019-07-02 MED ORDER — METOCLOPRAMIDE HCL 5 MG PO TABS: 5.0000 mg | ORAL_TABLET | Freq: Three times a day (TID) | ORAL | Status: DC | PRN

## 2019-07-02 MED ORDER — PHENYLEPHRINE HCL (PRESSORS) 10 MG/ML IV SOLN
INTRAVENOUS | Status: AC
  Filled 2019-07-02: qty 1

## 2019-07-02 MED ORDER — LACTATED RINGERS IV SOLN
INTRAVENOUS | Status: DC
  Administered 2019-07-02 (×2): via INTRAVENOUS

## 2019-07-02 MED ORDER — SODIUM CHLORIDE 0.9 % IJ SOLN
INTRAMUSCULAR | Status: DC | PRN
  Administered 2019-07-02: 50 mL

## 2019-07-02 MED ORDER — SODIUM CHLORIDE 0.9 % IV BOLUS
500.0000 mL | Freq: Once | INTRAVENOUS | Status: AC
  Administered 2019-07-02: 500 mL via INTRAVENOUS

## 2019-07-02 MED ORDER — BUPIVACAINE LIPOSOME 1.3 % IJ SUSP
INTRAMUSCULAR | Status: DC | PRN
  Administered 2019-07-02: 20 mL

## 2019-07-02 MED ORDER — 0.9 % SODIUM CHLORIDE (POUR BTL) OPTIME
TOPICAL | Status: DC | PRN
  Administered 2019-07-02: 1000 mL

## 2019-07-02 MED ORDER — ALUM & MAG HYDROXIDE-SIMETH 200-200-20 MG/5ML PO SUSP: 30.0000 mL | ORAL | Status: DC | PRN

## 2019-07-02 MED ORDER — PROPOFOL 10 MG/ML IV BOLUS
INTRAVENOUS | Status: AC
  Filled 2019-07-02: qty 20

## 2019-07-02 MED ORDER — CEFUROXIME SODIUM 1.5 G IV SOLR
INTRAVENOUS | Status: DC | PRN
  Administered 2019-07-02: 1.5 g via INTRAVENOUS

## 2019-07-02 MED ORDER — TRANEXAMIC ACID-NACL 1000-0.7 MG/100ML-% IV SOLN
1000.0000 mg | INTRAVENOUS | Status: AC
  Administered 2019-07-02: 1000 mg via INTRAVENOUS
  Filled 2019-07-02: qty 100

## 2019-07-02 MED ORDER — CEFAZOLIN SODIUM-DEXTROSE 2-4 GM/100ML-% IV SOLN
2.0000 g | Freq: Four times a day (QID) | INTRAVENOUS | Status: AC
  Administered 2019-07-02 (×2): 2 g via INTRAVENOUS
  Filled 2019-07-02 (×2): qty 100

## 2019-07-02 MED ORDER — HYDROMORPHONE HCL 1 MG/ML IJ SOLN: 0.5000 mg | INTRAMUSCULAR | Status: DC | PRN

## 2019-07-02 MED ORDER — ROPIVACAINE HCL 5 MG/ML IJ SOLN
INTRAMUSCULAR | Status: DC | PRN
  Administered 2019-07-02: 30 mL via PERINEURAL

## 2019-07-02 MED ORDER — ACETAMINOPHEN 500 MG PO TABS
1000.0000 mg | ORAL_TABLET | Freq: Four times a day (QID) | ORAL | Status: AC
  Administered 2019-07-02 – 2019-07-03 (×4): 1000 mg via ORAL
  Filled 2019-07-02 (×4): qty 2

## 2019-07-02 MED ORDER — POVIDONE-IODINE 10 % EX SWAB: 2.0000 "application " | Freq: Once | CUTANEOUS | Status: DC

## 2019-07-02 MED ORDER — PROPOFOL 10 MG/ML IV BOLUS
INTRAVENOUS | Status: AC
  Filled 2019-07-02: qty 40

## 2019-07-02 MED ORDER — CEFAZOLIN SODIUM-DEXTROSE 2-4 GM/100ML-% IV SOLN
2.0000 g | INTRAVENOUS | Status: AC
  Administered 2019-07-02: 09:00:00 2 g via INTRAVENOUS
  Filled 2019-07-02: qty 100

## 2019-07-02 MED ORDER — METOCLOPRAMIDE HCL 5 MG/ML IJ SOLN: 5.0000 mg | Freq: Three times a day (TID) | INTRAMUSCULAR | Status: DC | PRN

## 2019-07-02 MED ORDER — SODIUM CHLORIDE 0.9 % IV SOLN
INTRAVENOUS | Status: DC | PRN
  Administered 2019-07-02: 25 ug/min via INTRAVENOUS

## 2019-07-02 MED ORDER — POVIDONE-IODINE 10 % EX SWAB
2.0000 "application " | Freq: Once | CUTANEOUS | Status: AC
  Administered 2019-07-02: 2 via TOPICAL

## 2019-07-02 MED ORDER — DEXAMETHASONE SODIUM PHOSPHATE 10 MG/ML IJ SOLN
10.0000 mg | Freq: Three times a day (TID) | INTRAMUSCULAR | Status: AC
  Administered 2019-07-02 – 2019-07-03 (×4): 10 mg via INTRAVENOUS
  Filled 2019-07-02 (×4): qty 1

## 2019-07-02 MED ORDER — DEXAMETHASONE SODIUM PHOSPHATE 10 MG/ML IJ SOLN
INTRAMUSCULAR | Status: AC
  Filled 2019-07-02: qty 1

## 2019-07-02 MED ORDER — CHLORHEXIDINE GLUCONATE 4 % EX LIQD: 60.0000 mL | Freq: Once | CUTANEOUS | Status: DC

## 2019-07-02 MED ORDER — POLYETHYLENE GLYCOL 3350 17 G PO PACK
17.0000 g | PACK | Freq: Two times a day (BID) | ORAL | Status: DC
  Administered 2019-07-02 – 2019-07-03 (×2): 17 g via ORAL
  Filled 2019-07-02 (×4): qty 1

## 2019-07-02 MED ORDER — PHENOL 1.4 % MT LIQD
1.0000 | OROMUCOSAL | Status: DC | PRN
  Filled 2019-07-02: qty 177

## 2019-07-02 MED ORDER — BUPIVACAINE-EPINEPHRINE (PF) 0.25% -1:200000 IJ SOLN
INTRAMUSCULAR | Status: AC
  Filled 2019-07-02: qty 30

## 2019-07-02 MED ORDER — CEFUROXIME SODIUM 1.5 G IV SOLR
INTRAVENOUS | Status: AC
  Filled 2019-07-02: qty 1.5

## 2019-07-02 MED ORDER — SODIUM CHLORIDE 0.9 % IR SOLN
Status: DC | PRN
  Administered 2019-07-02: 2000 mL

## 2019-07-02 MED ORDER — POVIDONE-IODINE 7.5 % EX SOLN: Freq: Once | CUTANEOUS | Status: DC

## 2019-07-02 MED ORDER — MIDAZOLAM HCL 2 MG/2ML IJ SOLN
1.0000 mg | INTRAMUSCULAR | Status: DC
  Administered 2019-07-02: 1 mg via INTRAVENOUS
  Filled 2019-07-02: qty 2

## 2019-07-02 MED ORDER — BUPIVACAINE IN DEXTROSE 0.75-8.25 % IT SOLN
INTRATHECAL | Status: DC | PRN
  Administered 2019-07-02: 1.7 mL via INTRATHECAL

## 2019-07-02 MED ORDER — MENTHOL 3 MG MT LOZG: 1.0000 | LOZENGE | OROMUCOSAL | Status: DC | PRN

## 2019-07-02 SURGICAL SUPPLY — 58 items
ATTUNE PSFEM LTSZ5 NARCEM KNEE (Femur) ×2 IMPLANT
ATTUNE PSRP INSR SZ5 6 KNEE (Insert) ×2 IMPLANT
BAG ZIPLOCK 12X15 (MISCELLANEOUS) ×2 IMPLANT
BASE TIBIAL ROT PLAT SZ 5 KNEE (Knees) ×1 IMPLANT
BLADE HEX COATED 2.75 (ELECTRODE) ×2 IMPLANT
BLADE SAGITTAL 25.0X1.19X90 (BLADE) ×2 IMPLANT
BLADE SAW SGTL 13X75X1.27 (BLADE) ×2 IMPLANT
BLADE SURG SZ10 CARB STEEL (BLADE) ×2 IMPLANT
BNDG ELASTIC 6X10 VLCR STRL LF (GAUZE/BANDAGES/DRESSINGS) ×2 IMPLANT
BOWL SMART MIX CTS (DISPOSABLE) ×2 IMPLANT
CEMENT HV SMART SET (Cement) ×4 IMPLANT
CHLORAPREP W/TINT 26 (MISCELLANEOUS) ×4 IMPLANT
COVER SURGICAL LIGHT HANDLE (MISCELLANEOUS) ×2 IMPLANT
COVER WAND RF STERILE (DRAPES) ×2 IMPLANT
CUFF TOURN SGL QUICK 34 (TOURNIQUET CUFF) ×1
CUFF TRNQT CYL 34X4.125X (TOURNIQUET CUFF) ×1 IMPLANT
DECANTER SPIKE VIAL GLASS SM (MISCELLANEOUS) ×4 IMPLANT
DRAPE ORTHO SPLIT 77X108 STRL (DRAPES) ×1
DRAPE SHEET LG 3/4 BI-LAMINATE (DRAPES) ×2 IMPLANT
DRAPE SURG ORHT 6 SPLT 77X108 (DRAPES) ×1 IMPLANT
DRAPE U-SHAPE 47X51 STRL (DRAPES) ×2 IMPLANT
DRSG AQUACEL AG ADV 3.5X10 (GAUZE/BANDAGES/DRESSINGS) ×2 IMPLANT
ELECT REM PT RETURN 15FT ADLT (MISCELLANEOUS) ×2 IMPLANT
GLOVE BIO SURGEON STRL SZ7 (GLOVE) ×2 IMPLANT
GLOVE BIOGEL PI IND STRL 7.0 (GLOVE) ×1 IMPLANT
GLOVE BIOGEL PI IND STRL 7.5 (GLOVE) ×2 IMPLANT
GLOVE BIOGEL PI INDICATOR 7.0 (GLOVE) ×1
GLOVE BIOGEL PI INDICATOR 7.5 (GLOVE) ×2
GLOVE SS BIOGEL STRL SZ 7.5 (GLOVE) ×1 IMPLANT
GLOVE SUPERSENSE BIOGEL SZ 7.5 (GLOVE) ×1
GOWN STRL REUS W/ TWL LRG LVL3 (GOWN DISPOSABLE) ×2 IMPLANT
GOWN STRL REUS W/TWL LRG LVL3 (GOWN DISPOSABLE) ×2
HANDPIECE INTERPULSE COAX TIP (DISPOSABLE) ×1
HOLDER FOLEY CATH W/STRAP (MISCELLANEOUS) ×2 IMPLANT
HOOD PEEL AWAY FLYTE STAYCOOL (MISCELLANEOUS) ×6 IMPLANT
KIT TURNOVER KIT A (KITS) ×2 IMPLANT
MANIFOLD NEPTUNE II (INSTRUMENTS) ×2 IMPLANT
MARKER SKIN DUAL TIP RULER LAB (MISCELLANEOUS) ×2 IMPLANT
NEEDLE HYPO 22GX1.5 SAFETY (NEEDLE) ×2 IMPLANT
NS IRRIG 1000ML POUR BTL (IV SOLUTION) ×2 IMPLANT
PACK TOTAL KNEE CUSTOM (KITS) ×2 IMPLANT
PATELLA MEDIAL ATTUN 35MM KNEE (Knees) ×2 IMPLANT
PIN STEINMAN FIXATION KNEE (PIN) ×2 IMPLANT
PROTECTOR NERVE ULNAR (MISCELLANEOUS) ×2 IMPLANT
SET HNDPC FAN SPRY TIP SCT (DISPOSABLE) ×1 IMPLANT
STAPLER VISISTAT 35W (STAPLE) IMPLANT
STRIP CLOSURE SKIN 1/2X4 (GAUZE/BANDAGES/DRESSINGS) ×2 IMPLANT
SUT MNCRL AB 3-0 PS2 18 (SUTURE) ×2 IMPLANT
SUT VIC AB 0 CT1 36 (SUTURE) ×4 IMPLANT
SUT VIC AB 1 CT1 36 (SUTURE) ×2 IMPLANT
SUT VIC AB 2-0 CT1 27 (SUTURE) ×2
SUT VIC AB 2-0 CT1 TAPERPNT 27 (SUTURE) ×2 IMPLANT
SYR CONTROL 10ML LL (SYRINGE) ×4 IMPLANT
TIBIAL BASE ROT PLAT SZ 5 KNEE (Knees) ×2 IMPLANT
TRAY FOLEY MTR SLVR 14FR STAT (SET/KITS/TRAYS/PACK) ×2 IMPLANT
WATER STERILE IRR 1000ML POUR (IV SOLUTION) ×4 IMPLANT
YANKAUER SUCT BULB TIP 10FT TU (MISCELLANEOUS) ×2 IMPLANT
YANKAUER SUCT BULB TIP NO VENT (SUCTIONS) IMPLANT

## 2019-07-02 NOTE — Anesthesia Procedure Notes (Signed)
Spinal  Patient location during procedure: OR Start time: 07/02/2019 9:21 AM End time: 07/02/2019 9:25 AM Staffing Anesthesiologist: Josephine Igo, MD Resident/CRNA: Glory Buff, CRNA Performed: resident/CRNA  Preanesthetic Checklist Completed: patient identified, site marked, surgical consent, pre-op evaluation, timeout performed, IV checked, risks and benefits discussed and monitors and equipment checked Spinal Block Patient position: sitting Prep: DuraPrep Patient monitoring: heart rate, cardiac monitor, continuous pulse ox and blood pressure Approach: midline Location: L3-4 Injection technique: single-shot Needle Needle type: Pencan  Needle gauge: 24 G Needle length: 9 cm Assessment Sensory level: T4 Additional Notes Kit expiration date checked and verified.  Sterile prep and drape, skin local with 1% lidocaine, - heme, - paraesthesia, + CSF pre and post injection, patient tolerated procedure well.

## 2019-07-02 NOTE — Op Note (Signed)
MRN:     [id number] DOB/AGE:    April 29, 1951 / 68 y.o.       OPERATIVE REPORT   DATE OF PROCEDURE:  07/02/2019      PREOPERATIVE DIAGNOSIS:   Primary Localized Osteoarthritis left Knee       Estimated body mass index is 27.98 kg/m as calculated from the following:   Height as of this encounter: 5\' 2"  (1.575 m).   Weight as of this encounter: 69.4 kg.                                                       POSTOPERATIVE DIAGNOSIS:   Same                                                                 PROCEDURE:  Procedure(s): TOTAL KNEE ARTHROPLASTY Using Depuy Attune RP implants #5 narrow Femur, #5Tibia, 65mm  RP bearing, 35 Patella    SURGEON: Keimari Cornfields A. Noemi Chapel, MD   ASSISTANT: Matthew Saras, PA-C, present and scrubbed throughout the case, critical for retraction, instrumentation, and closure.  ANESTHESIA: Spinal with Adductor Nerve Block  TOURNIQUET TIME: 52 minutes   COMPLICATIONS:  None       SPECIMENS: None   INDICATIONS FOR PROCEDURE: The patient has djd of the knee with varus deformities, XR shows bone on bone arthritis. Patient has failed all conservative measures including anti-inflammatory medicines, narcotics, attempts at exercise and weight loss, cortisone injections and viscosupplementation.  Risks and benefits of surgery have been discussed, questions answered.    DESCRIPTION OF PROCEDURE: The patient identified by armband, received right adductor canal block and IV antibiotics, in the holding area at [facility]. Patient taken to the operating room, appropriate anesthetic monitors were attached. Spinal anesthesia induced with the patient in supine position, Foley catheter was inserted. Tourniquet applied high to the operative thigh. Lateral post and foot positioner applied to the table, the lower extremity was then prepped and draped in usual sterile fashion from the ankle to the tourniquet. Time-out procedure was performed. The limb was wrapped with an Esmarch  bandage and the tourniquet inflated to 365 mmHg.   We began the operation by making a 6cm anterior midline incision. Small bleeders in the skin and the subcutaneous tissue identified and cauterized. Transverse retinaculum was incised and reflected medially and a medial parapatellar arthrotomy was accomplished. the patella was everted and theprepatellar fat pad resected. The superficial medial collateral ligament was then elevated from anterior to posterior along the proximal flare of the tibia and anterior half of the menisci resected. The knee was hyperflexed exposing bone on bone arthritis. Peripheral and notch osteophytes as well as the cruciate ligaments were then resected. We continued to work our way around posteriorly along the proximal tibia, and externally rotated the tibia subluxing it out from underneath the femur. A McHale retractor was placed through the notch and a lateral Hohmann retractor placed, and an external tibial guide was placed.  The tibial cutting guide was pinned into place allowing resection of 4 mm of bone medially and about 6 mm of bone laterally because of  her varus deformity.   Satisfied with the tibial resection, we then entered the distal femur 2 mm anterior to the PCL origin with the intramedullary guide rod and applied the distal femoral cutting guide set at 26mm, with 5 degrees of valgus. This was pinned along the epicondylar axis. At this point, the distal femoral cut was accomplished without difficulty. We then sized for a 5 narrow femoral component and pinned the guide in 3 degrees of external rotation.The chamfer cutting guide was pinned into place. The anterior, posterior, and chamfer cuts were accomplished without difficulty followed by the  RP box cutting guide and the box cut. We also removed posterior osteophytes from the posterior femoral condyles. At this time, the knee was brought into full extension. We checked our extension and flexion gaps and found them  symmetric at 6.  The patella thickness measured at 31m m. We set the cutting guide at 13 and removed the posterior patella sized for 35 button and drilled the lollipop. The knee was then once again hyperflexed exposing the proximal tibia. We sized for a # 5 tibial base plate, applied the smokestack and the conical reamer followed by the the Delta fin keel punch. We then hammered into place the  RP trial femoral component, inserted a trial bearing, trial patellar button, and took the knee through range of motion from 0-130 degrees. No thumb pressure was required for patellar tracking.   At this point, all trial components were removed, a double batch of DePuy HV with Zinacef 1.5 gms cement  was mixed and applied to all bony metallic mating surfaces. In order, we hammered into place the tibial tray and removed excess cement, the femoral component and removed excess cement, a 6 mm  RP bearing was inserted, and the knee brought to full extension with compression. The patellar button was clamped into place, and excess cement removed. While the cement cured the wound was irrigated out with normal saline solution pulse lavage, and exparel was injected throughout the knee. Ligament stability and patellar tracking were checked and found to be excellent..   The parapatellar arthrotomy was closed with  #1 Vicryl suture. The subcutaneous tissue with 0 and 2-0 undyed Vicryl suture, and 4-0 Monocryl.. A dressing of Aquaseal, 4 x 4, dressing sponges, Webril, and Ace wrap applied. Needle and sponge count were correct times 2.The patient awakened, extubated, and taken to recovery room without difficulty. Vascular status was normal, pulses 2+ and symmetric.    [name] 02/13/2018, 8:56 AM

## 2019-07-02 NOTE — Evaluation (Signed)
Physical Therapy Evaluation Patient Details Name: Maria Bautista MRN: [id number] DOB: [birth year] Today's Date: 07/02/2019   History of Present Illness  68 yo female s/p L TKA 8/10  Clinical Impression  On eval POD 0, pt required Min assist for mobility. She walked ~75 feet with a RW. Minimal pain with activity. Pt is very motivated to progress well. D/C plan is for home with HHPT.     Follow Up Recommendations Follow surgeon's recommendation for DC plan and follow-up therapies    Equipment Recommendations  None recommended by PT    Recommendations for Other Services       Precautions / Restrictions Precautions Precautions: Fall;Knee Restrictions Weight Bearing Restrictions: No Other Position/Activity Restrictions: WBAT      Mobility  Bed Mobility Overal bed mobility: Needs Assistance Bed Mobility: Supine to Sit     Supine to sit: Min assist     General bed mobility comments: Assist for L LE. Increased time.  Transfers Overall transfer level: Needs assistance Equipment used: Rolling walker (2 wheeled) Transfers: Sit to/from Stand Sit to Stand: Min guard         General transfer comment: VCs safety, technique, hand placement. Close guard 2* L LE weakness.  Ambulation/Gait Ambulation/Gait assistance: Min assist Gait Distance (Feet): 75 Feet Assistive device: Rolling walker (2 wheeled) Gait Pattern/deviations: Step-to pattern     General Gait Details: VCs safety, technique, sequence. Assist to stabilize throughout distance 2* intermittent LE buckling. Slow gait speed. Pt tolerated distance well. No c/o lightheadedness.  Stairs            Wheelchair Mobility    Modified Rankin (Stroke Patients Only)       Balance Overall balance assessment: Needs assistance         Standing balance support: Bilateral upper extremity supported Standing balance-Leahy Scale: Poor                               Pertinent Vitals/Pain Pain  Assessment: 0-10 Pain Score: 2  Pain Location: L knee Pain Descriptors / Indicators: Aching;Sore Pain Intervention(s): Monitored during session;Ice applied    Home Living Family/patient expects to be discharged to:: Private residence Living Arrangements: Alone Available Help at Discharge: Family(daughter) Type of Home: Apartment Home Access: Stairs to enter Entrance Stairs-Rails: Right Entrance Stairs-Number of Steps: 1 flight Home Layout: One level Home Equipment: [place] - 2 wheels;Bedside commode      Prior Function Level of Independence: Independent               Hand Dominance        Extremity/Trunk Assessment   Upper Extremity Assessment Upper Extremity Assessment: Overall WFL for tasks assessed    Lower Extremity Assessment Lower Extremity Assessment: (post op weakness 2* TKA)    Cervical / Trunk Assessment Cervical / Trunk Assessment: Normal  Communication   Communication: No difficulties  Cognition Arousal/Alertness: Awake/alert Behavior During Therapy: WFL for tasks assessed/performed Overall Cognitive Status: Within Functional Limits for tasks assessed                                        General Comments      Exercises Total Joint Exercises Ankle Circles/Pumps: AROM;Both;5 reps;Supine Quad Sets: AROM;Left;5 reps;Supine Straight Leg Raises: AAROM;Left;5 reps;Supine   Assessment/Plan    PT Assessment Patient needs continued PT services  PT Problem List  Decreased strength;Decreased mobility;Decreased range of motion;Decreased activity tolerance;Decreased balance;Decreased knowledge of use of DME;Pain       PT Treatment Interventions DME instruction;Gait training;Therapeutic exercise;Therapeutic activities;Patient/family education;Balance training;Functional mobility training;Stair training    PT Goals (Current goals can be found in the Care Plan section)  Acute Rehab PT Goals Patient Stated Goal: regain PLOF. Less  pain. PT Goal Formulation: With patient Time For Goal Achievement: 07/16/19 Potential to Achieve Goals: Good    Frequency 7X/week   Barriers to discharge        Co-evaluation               AM-PAC PT "6 Clicks" Mobility  Outcome Measure Help needed turning from your back to your side while in a flat bed without using bedrails?: A Little Help needed moving from lying on your back to sitting on the side of a flat bed without using bedrails?: A Little Help needed moving to and from a bed to a chair (including a wheelchair)?: A Little Help needed standing up from a chair using your arms (e.g., wheelchair or bedside chair)?: A Little Help needed to walk in hospital room?: A Little Help needed climbing 3-5 steps with a railing? : A Little 6 Click Score: 18    End of Session Equipment Utilized During Treatment: Gait belt Activity Tolerance: Patient tolerated treatment well Patient left: in chair;with call bell/phone within reach   PT Visit Diagnosis: Other abnormalities of gait and mobility (R26.89)    Time: 7357-8978 PT Time Calculation (min) (ACUTE ONLY): 17 min   Charges:   PT Evaluation $PT Eval Low Complexity: [place], PT Acute Rehabilitation Services Pager: [phone] Office: [phone]

## 2019-07-02 NOTE — Interval H&P Note (Signed)
History and Physical Interval Note:  07/02/2019 9:01 AM  Maria Bautista  has presented today for surgery, with the diagnosis of djd left knee.  The various methods of treatment have been discussed with the patient and family. After consideration of risks, benefits and other options for treatment, the patient has consented to  Procedure(s): TOTAL KNEE ARTHROPLASTY (Left) as a surgical intervention.  The patient's history has been reviewed, patient examined, no change in status, stable for surgery.  I have reviewed the patient's chart and labs.  Questions were answered to the patient's satisfaction.     [name]

## 2019-07-02 NOTE — Anesthesia Postprocedure Evaluation (Signed)
Anesthesia Post Note  Patient: Maria Bautista  Procedure(s) Performed: TOTAL KNEE ARTHROPLASTY (Left Knee)     Patient location during evaluation: PACU Anesthesia Type: Spinal Level of consciousness: oriented and awake and alert Pain management: pain level controlled Vital Signs Assessment: post-procedure vital signs reviewed and stable Respiratory status: spontaneous breathing, respiratory function stable and nonlabored ventilation Cardiovascular status: blood pressure returned to baseline and stable Postop Assessment: no headache, no backache, no apparent nausea or vomiting, spinal receding and patient able to bend at knees Anesthetic complications: no    Last Vitals:  Vitals:   07/02/19 1130 07/02/19 1200  BP: 100/68 100/76  Pulse: 79 79  Resp: 16 16  Temp: 36.6 C 36.6 C  SpO2: 100% 99%    Last Pain:  Vitals:   07/02/19 1200  TempSrc:   PainSc: 0-No pain                 Fia Hebert A.

## 2019-07-02 NOTE — Anesthesia Procedure Notes (Signed)
Anesthesia Regional Block: Adductor canal block   Pre-Anesthetic Checklist: ,, timeout performed, Correct Patient, Correct Site, Correct Laterality, Correct Procedure, Correct Position, site marked, Risks and benefits discussed,  Surgical consent,  Pre-op evaluation,  At surgeon's request and post-op pain management  Laterality: Left  Prep: chloraprep       Needles:  Injection technique: Single-shot  Needle Type: Echogenic Stimulator Needle     Needle Length: 9cm  Needle Gauge: 21   Needle insertion depth: 8 cm   Additional Needles:   Procedures:,,,, ultrasound used (permanent image in chart),,,,  Narrative:  Start time: 07/02/2019 8:45 AM End time: 07/02/2019 8:50 AM Injection made incrementally with aspirations every 5 mL.  Performed by: Personally  Anesthesiologist: Josephine Igo, MD  Additional Notes: Timeout performed. Patient sedated. Relevant anatomy ID'd using [place]. Incremental 2-45ml injection of LA with frequent aspiration. Patient tolerated procedure well.        Left Adductor Canal Block

## 2019-07-02 NOTE — Anesthesia Procedure Notes (Signed)
Date/Time: 07/02/2019 9:15 AM Performed by: Glory Buff, CRNA Oxygen Delivery Method: Simple face mask

## 2019-07-02 NOTE — Transfer of Care (Signed)
Immediate Anesthesia Transfer of Care Note  Patient: Maria Bautista  Procedure(s) Performed: TOTAL KNEE ARTHROPLASTY (Left Knee)  Patient Location: PACU  Anesthesia Type:Spinal  Level of Consciousness: awake and alert   Airway & Oxygen Therapy: Patient Spontanous Breathing and Patient connected to face mask oxygen  Post-op Assessment: Report given to RN and Post -op Vital signs reviewed and stable  Post vital signs: Reviewed and stable  Last Vitals:  Vitals Value Taken Time  BP    Temp    Pulse    Resp    SpO2      Last Pain:  Vitals:   07/02/19 0855  TempSrc:   PainSc: 0-No pain         Complications: No apparent anesthesia complications

## 2019-07-02 NOTE — Progress Notes (Signed)
AssistedDr. Foster with left, ultrasound guided, adductor canal block. Side rails up, monitors on throughout procedure. See vital signs in flow sheet. Tolerated Procedure well.  

## 2019-07-03 ENCOUNTER — Encounter (HOSPITAL_COMMUNITY): Payer: Self-pay | Admitting: Physician Assistant

## 2019-07-03 DIAGNOSIS — Z8249 Family history of ischemic heart disease and other diseases of the circulatory system: Secondary | ICD-10-CM | POA: Diagnosis not present

## 2019-07-03 DIAGNOSIS — D62 Acute posthemorrhagic anemia: Secondary | ICD-10-CM | POA: Diagnosis not present

## 2019-07-03 DIAGNOSIS — Z7982 Long term (current) use of aspirin: Secondary | ICD-10-CM | POA: Diagnosis not present

## 2019-07-03 DIAGNOSIS — R55 Syncope and collapse: Secondary | ICD-10-CM

## 2019-07-03 DIAGNOSIS — Z885 Allergy status to narcotic agent status: Secondary | ICD-10-CM | POA: Diagnosis not present

## 2019-07-03 DIAGNOSIS — K219 Gastro-esophageal reflux disease without esophagitis: Secondary | ICD-10-CM | POA: Diagnosis present

## 2019-07-03 DIAGNOSIS — Z8349 Family history of other endocrine, nutritional and metabolic diseases: Secondary | ICD-10-CM | POA: Diagnosis not present

## 2019-07-03 DIAGNOSIS — Z825 Family history of asthma and other chronic lower respiratory diseases: Secondary | ICD-10-CM | POA: Diagnosis not present

## 2019-07-03 DIAGNOSIS — M1712 Unilateral primary osteoarthritis, left knee: Secondary | ICD-10-CM | POA: Diagnosis not present

## 2019-07-03 DIAGNOSIS — I10 Essential (primary) hypertension: Secondary | ICD-10-CM | POA: Diagnosis present

## 2019-07-03 DIAGNOSIS — Z841 Family history of disorders of kidney and ureter: Secondary | ICD-10-CM | POA: Diagnosis not present

## 2019-07-03 DIAGNOSIS — E049 Nontoxic goiter, unspecified: Secondary | ICD-10-CM | POA: Diagnosis present

## 2019-07-03 DIAGNOSIS — Z96652 Presence of left artificial knee joint: Secondary | ICD-10-CM | POA: Diagnosis not present

## 2019-07-03 DIAGNOSIS — Z887 Allergy status to serum and vaccine status: Secondary | ICD-10-CM | POA: Diagnosis not present

## 2019-07-03 DIAGNOSIS — Z833 Family history of diabetes mellitus: Secondary | ICD-10-CM | POA: Diagnosis not present

## 2019-07-03 DIAGNOSIS — M8580 Other specified disorders of bone density and structure, unspecified site: Secondary | ICD-10-CM | POA: Diagnosis not present

## 2019-07-03 DIAGNOSIS — E0590 Thyrotoxicosis, unspecified without thyrotoxic crisis or storm: Secondary | ICD-10-CM | POA: Diagnosis present

## 2019-07-03 HISTORY — DX: Syncope and collapse: R55

## 2019-07-03 HISTORY — DX: Acute posthemorrhagic anemia: D62

## 2019-07-03 LAB — CBC
HCT: 27.3 % — ABNORMAL LOW (ref 36.0–46.0)
HCT: 26.6 % — ABNORMAL LOW (ref 36.0–46.0)
HCT: 33 % — ABNORMAL LOW (ref 36.0–46.0)
Hemoglobin: 8.7 g/dL — ABNORMAL LOW (ref 12.0–15.0)
Hemoglobin: 8.6 g/dL — ABNORMAL LOW (ref 12.0–15.0)
Hemoglobin: 11 g/dL — ABNORMAL LOW (ref 12.0–15.0)
MCH: 29.5 pg (ref 26.0–34.0)
MCH: 30 pg (ref 26.0–34.0)
MCH: 29.7 pg (ref 26.0–34.0)
MCHC: 31.9 g/dL (ref 30.0–36.0)
MCHC: 32.3 g/dL (ref 30.0–36.0)
MCHC: 33.3 g/dL (ref 30.0–36.0)
MCV: 92.5 fL (ref 80.0–100.0)
MCV: 92.7 fL (ref 80.0–100.0)
MCV: 89.2 fL (ref 80.0–100.0)
Platelets: 228 10*3/uL (ref 150–400)
Platelets: 226 10*3/uL (ref 150–400)
Platelets: 217 10*3/uL (ref 150–400)
RBC: 2.95 MIL/uL — ABNORMAL LOW (ref 3.87–5.11)
RBC: 2.87 MIL/uL — ABNORMAL LOW (ref 3.87–5.11)
RBC: 3.7 MIL/uL — ABNORMAL LOW (ref 3.87–5.11)
RDW: 11.9 % (ref 11.5–15.5)
RDW: 11.9 % (ref 11.5–15.5)
RDW: 12.9 % (ref 11.5–15.5)
WBC: 12.3 10*3/uL — ABNORMAL HIGH (ref 4.0–10.5)
WBC: 12 10*3/uL — ABNORMAL HIGH (ref 4.0–10.5)
WBC: 12.5 10*3/uL — ABNORMAL HIGH (ref 4.0–10.5)
nRBC: 0 % (ref 0.0–0.2)
nRBC: 0 % (ref 0.0–0.2)
nRBC: 0 % (ref 0.0–0.2)

## 2019-07-03 LAB — BASIC METABOLIC PANEL
Anion gap: 7 (ref 5–15)
BUN: 13 mg/dL (ref 8–23)
CO2: 22 mmol/L (ref 22–32)
Calcium: 9.1 mg/dL (ref 8.9–10.3)
Chloride: 102 mmol/L (ref 98–111)
Creatinine, Ser: 0.55 mg/dL (ref 0.44–1.00)
GFR calc Af Amer: >60 mL/min (ref >60)
GFR calc non Af Amer: >60 mL/min (ref >60)
Glucose, Bld: 172 mg/dL — ABNORMAL HIGH (ref 70–99)
Potassium: 4.5 mmol/L (ref 3.5–5.1)
Sodium: 131 mmol/L — ABNORMAL LOW (ref 135–145)

## 2019-07-03 LAB — GLUCOSE, CAPILLARY
Glucose-Capillary: 142 mg/dL — ABNORMAL HIGH (ref 70–99)
Glucose-Capillary: 174 mg/dL — ABNORMAL HIGH (ref 70–99)
Glucose-Capillary: 185 mg/dL — ABNORMAL HIGH (ref 70–99)
Glucose-Capillary: 147 mg/dL — ABNORMAL HIGH (ref 70–99)

## 2019-07-03 LAB — PREPARE RBC (CROSSMATCH)

## 2019-07-03 LAB — HEMOGLOBIN A1C
Hgb A1c MFr Bld: 5.2 % (ref 4.8–5.6)
Mean Plasma Glucose: 102.54 mg/dL

## 2019-07-03 LAB — URINE CULTURE: Culture: NO GROWTH

## 2019-07-03 LAB — ABO/RH: ABO/RH(D): AB POS

## 2019-07-03 MED ORDER — SODIUM CHLORIDE 0.9% IV SOLUTION
Freq: Once | INTRAVENOUS | Status: AC
  Administered 2019-07-03: 10:00:00 via INTRAVENOUS

## 2019-07-03 MED ORDER — FUROSEMIDE 10 MG/ML IJ SOLN
20.0000 mg | Freq: Once | INTRAMUSCULAR | Status: AC
  Administered 2019-07-03: 20 mg via INTRAVENOUS
  Filled 2019-07-03: qty 2

## 2019-07-03 MED ORDER — SODIUM CHLORIDE 0.9 % IV BOLUS
500.0000 mL | Freq: Once | INTRAVENOUS | Status: AC
  Administered 2019-07-03: 500 mL via INTRAVENOUS

## 2019-07-03 MED ORDER — INSULIN ASPART 100 UNIT/ML ~~LOC~~ SOLN
0.0000 [IU] | Freq: Three times a day (TID) | SUBCUTANEOUS | Status: DC
  Administered 2019-07-03 (×2): 3 [IU] via SUBCUTANEOUS

## 2019-07-03 MED ORDER — INSULIN ASPART 100 UNIT/ML ~~LOC~~ SOLN: 0.0000 [IU] | Freq: Every day | SUBCUTANEOUS | Status: DC

## 2019-07-03 NOTE — Progress Notes (Signed)
Subjective: 1 Day Post-Op Procedure(s) (LRB): TOTAL KNEE ARTHROPLASTY (Left) Patient reports pain as 2 on 0-10 scale.   Patient sitting in chair    Unable to lay in bed all night due to leg pain Objective: Vital signs in last 24 hours: Temp:  [97.5 F (36.4 C)-98.2 F (36.8 C)] 97.6 F (36.4 C) (08/11 0513) Pulse Rate:  [61-93] 73 (08/11 0650) Resp:  [14-18] 16 (08/11 0513) BP: (63-138)/(42-76) 93/50 (08/11 0650) SpO2:  [98 %-100 %] 100 % (08/11 0513) Weight:  [70 kg] 70 kg (08/10 1232)  Intake/Output from previous day: 08/10 0701 - 08/11 0700 In: 4130.8 [P.O.:560; I.V.:3020.8; IV Piggyback:550] Out: 3710 [Urine:4400; Blood:25] Intake/Output this shift: Total I/O In: 1272.6 [P.O.:120; I.V.:1152.6] Out: 2300 [Urine:2300]  Recent Labs    07/03/19 0240  HGB 8.7*   Recent Labs    07/03/19 0240  WBC 12.3*  RBC 2.95*  HCT 27.3*  PLT 228   Recent Labs    07/03/19 0240  NA 131*  K 4.5  CL 102  CO2 22  BUN 13  CREATININE 0.55  GLUCOSE 172*  CALCIUM 9.1   No results for input(s): LABPT, INR in the last 72 hours.  ABD soft Neurovascular intact Sensation intact distally Intact pulses distally Incision: scant drainage   Assessment/Plan: 1 Day Post-Op Procedure(s) (LRB): TOTAL KNEE ARTHROPLASTY (Left)  Principal Problem:   Primary localized osteoarthritis of left knee Active Problems:   Borderline hyperglycemia   Syncope and collapse   Postoperative anemia due to acute blood loss  Patient had a syncopal episode this am on walk.  Patient's blood pressure was down in the 60's over 40's.  She has dropped her hemoglobin 4 points since admission despite use of TXA   Will order blood transfusion in the setting of syncope and hypotension with acute blood loss anemia.    Will switch patient from outpatient to inpatient as she is not medically stable to be discharged.    Anticipated LOS equal to or greater than 2 midnights due to - Age 70 and older with one or more  of the following:  - Obesity  - Expected need for hospital services (PT, OT, Nursing) required for safe  discharge  - Anticipated need for postoperative skilled nursing care or inpatient rehab  - Active co-morbidities: None OR   - Unanticipated findings during/Post Surgery: Lab abnormalities, Slow post-op progression: GI, pain control, mobility and syncopal episode this am.    - Patient is a high risk of re-admission due to: None    Avalyn Molino J Lourdes Kucharski 07/03/2019, 6:59 AM

## 2019-07-03 NOTE — Progress Notes (Signed)
Therapy plans: [facility]  Has DME

## 2019-07-03 NOTE — Progress Notes (Signed)
Physical Therapy Treatment Patient Details Name: Maria Bautista MRN: [id number] DOB: February 16, 1951 Today's Date: 07/03/2019    History of Present Illness 68 yo female s/p L TKA 8/10    PT Comments    Tx session between blood transfusion units. Pt tolerated session well. She denied lightheadedness/dizziness. Per pt and RN, she is not discharging home on today. Will continue to follow and progress activity as tolerated.    Follow Up Recommendations  Follow surgeon's recommendation for DC plan and follow-up therapies     Equipment Recommendations  None recommended by PT    Recommendations for Other Services       Precautions / Restrictions Precautions Precautions: Knee;Fall Restrictions Weight Bearing Restrictions: No Other Position/Activity Restrictions: WBAT    Mobility  Bed Mobility Overal bed mobility: Needs Assistance Bed Mobility: Supine to Sit     Supine to sit: Min assist     General bed mobility comments: Assist for L LE. Increased time.  Transfers Overall transfer level: Needs assistance Equipment used: Rolling walker (2 wheeled) Transfers: Sit to/from Stand Sit to Stand: Min guard         General transfer comment: VCs safety, technique, hand placement. Close guard for safety.  Ambulation/Gait Ambulation/Gait assistance: Min guard Gait Distance (Feet): 160 Feet Assistive device: Rolling walker (2 wheeled) Gait Pattern/deviations: Step-to pattern;Step-through pattern;Decreased stride length     General Gait Details: VCs safety, technique, sequence. Slow gait speed. Pt tolerated distance well. No c/o lightheadedness.   Stairs             Wheelchair Mobility    Modified Rankin (Stroke Patients Only)       Balance Overall balance assessment: Needs assistance         Standing balance support: Bilateral upper extremity supported Standing balance-Leahy Scale: Poor                              Cognition Arousal/Alertness:  Awake/alert Behavior During Therapy: WFL for tasks assessed/performed Overall Cognitive Status: Within Functional Limits for tasks assessed                                        Exercises Total Joint Exercises Ankle Circles/Pumps: AROM;Both;10 reps;Seated Quad Sets: AROM;Left;10 reps;Seated Hip ABduction/ADduction: AROM;Left;10 reps;Supine Straight Leg Raises: AROM;AAROM;Left;10 reps;Supine Long Arc Quad: AROM;Left;10 reps;Seated Knee Flexion: AAROM;Left;10 reps;Seated Goniometric ROM: ~10-80 degrees    General Comments        Pertinent Vitals/Pain Pain Assessment: 0-10 Pain Score: 5  Pain Location: L knee Pain Descriptors / Indicators: Aching;Sore Pain Intervention(s): Monitored during session;Ice applied    Home Living                      Prior Function            PT Goals (current goals can now be found in the care plan section) Progress towards PT goals: Progressing toward goals    Frequency    7X/week      PT Plan Current plan remains appropriate    Co-evaluation              AM-PAC PT "6 Clicks" Mobility   Outcome Measure  Help needed turning from your back to your side while in a flat bed without using bedrails?: A Little Help needed moving from lying on your back to sitting  on the side of a flat bed without using bedrails?: A Little Help needed moving to and from a bed to a chair (including a wheelchair)?: A Little Help needed standing up from a chair using your arms (e.g., wheelchair or bedside chair)?: A Little Help needed to walk in hospital room?: A Little Help needed climbing 3-5 steps with a railing? : A Little 6 Click Score: 18    End of Session Equipment Utilized During Treatment: Gait belt Activity Tolerance: Patient tolerated treatment well Patient left: in chair;with call bell/phone within reach   PT Visit Diagnosis: Other abnormalities of gait and mobility (R26.89);Pain Pain - Right/Left: Left Pain  - part of body: Knee     Time: 1430-1504 PT Time Calculation (min) (ACUTE ONLY): 34 min  Charges:  $Gait Training: 8-22 mins $Therapeutic Exercise: 8-22 mins                        Weston Anna, PT Acute Rehabilitation Services Pager: [phone] Office: [phone]

## 2019-07-04 LAB — BASIC METABOLIC PANEL
Anion gap: 8 (ref 5–15)
BUN: 14 mg/dL (ref 8–23)
CO2: 25 mmol/L (ref 22–32)
Calcium: 9.5 mg/dL (ref 8.9–10.3)
Chloride: 104 mmol/L (ref 98–111)
Creatinine, Ser: 0.53 mg/dL (ref 0.44–1.00)
GFR calc Af Amer: >60 mL/min (ref >60)
GFR calc non Af Amer: >60 mL/min (ref >60)
Glucose, Bld: 163 mg/dL — ABNORMAL HIGH (ref 70–99)
Potassium: 4.2 mmol/L (ref 3.5–5.1)
Sodium: 137 mmol/L (ref 135–145)

## 2019-07-04 LAB — TYPE AND SCREEN
ABO/RH(D): AB POS
Antibody Screen: NEGATIVE
Unit division: 0
Unit division: 0

## 2019-07-04 LAB — CBC
HCT: 32.8 % — ABNORMAL LOW (ref 36.0–46.0)
Hemoglobin: 10.7 g/dL — ABNORMAL LOW (ref 12.0–15.0)
MCH: 29.3 pg (ref 26.0–34.0)
MCHC: 32.6 g/dL (ref 30.0–36.0)
MCV: 89.9 fL (ref 80.0–100.0)
Platelets: 232 10*3/uL (ref 150–400)
RBC: 3.65 MIL/uL — ABNORMAL LOW (ref 3.87–5.11)
RDW: 13.2 % (ref 11.5–15.5)
WBC: 12 10*3/uL — ABNORMAL HIGH (ref 4.0–10.5)
nRBC: 0 % (ref 0.0–0.2)

## 2019-07-04 LAB — BPAM RBC
Blood Product Expiration Date: 202008292359
Blood Product Expiration Date: 202008292359
ISSUE DATE / TIME: 202008111021
ISSUE DATE / TIME: 202008111758
Unit Type and Rh: 6200
Unit Type and Rh: 6200

## 2019-07-04 LAB — GLUCOSE, CAPILLARY: Glucose-Capillary: 117 mg/dL — ABNORMAL HIGH (ref 70–99)

## 2019-07-04 MED ORDER — OXYCODONE HCL 5 MG PO TABS: 5.0000 mg | ORAL_TABLET | ORAL | 0 refills | Status: DC | PRN

## 2019-07-04 MED ORDER — ASPIRIN 325 MG PO TBEC: DELAYED_RELEASE_TABLET | ORAL | 0 refills | Status: DC

## 2019-07-04 MED ORDER — DOCUSATE SODIUM 100 MG PO CAPS: ORAL_CAPSULE | ORAL | 0 refills | Status: DC

## 2019-07-04 MED ORDER — ALPRAZOLAM 0.25 MG PO TABS: 0.2500 mg | ORAL_TABLET | Freq: Three times a day (TID) | ORAL | 0 refills | Status: DC | PRN

## 2019-07-04 MED ORDER — GABAPENTIN 300 MG PO CAPS: 300.0000 mg | ORAL_CAPSULE | Freq: Every day | ORAL | 0 refills | Status: DC

## 2019-07-04 MED ORDER — POLYETHYLENE GLYCOL 3350 17 G PO PACK: PACK | ORAL | 0 refills | Status: DC

## 2019-07-04 NOTE — Discharge Summary (Addendum)
Patient ID: Maria Bautista MRN: [id number] DOB/AGE: 07-16-1951 67 y.o.  Admit date: 07/02/2019 Discharge date: 07/04/2019  Admission Diagnoses:  Principal Problem:   Primary localized osteoarthritis of left knee Active Problems:   Borderline hyperglycemia   Syncope and collapse   Postoperative anemia due to acute blood loss   Discharge Diagnoses:  Same  Past Medical History:  Diagnosis Date  . Allergy   . Arthritis   . Cataract    Bilateral  . GERD (gastroesophageal reflux disease)   . Goiter   . History of kidney stones   . Hyperthyroidism   . Postoperative anemia due to acute blood loss 07/03/2019  . Primary localized osteoarthritis of left knee 06/20/2019  . Syncope and collapse 07/03/2019  . Trouble swallowing    history of    Surgeries: Procedure(s): TOTAL KNEE ARTHROPLASTY on 07/02/2019   Consultants:   Discharged Condition: Improved  Hospital Course: Maria Bautista is an 68 y.o. female who was admitted 07/02/2019 for operative treatment ofPrimary localized osteoarthritis of left knee. Patient has severe unremitting pain that affects sleep, daily activities, and work/hobbies. After pre-op clearance the patient was taken to the operating room on 07/02/2019 and underwent  Procedure(s): TOTAL KNEE ARTHROPLASTY.    Patient was given perioperative antibiotics:  Anti-infectives (From admission, onward)   Start     Dose/Rate Route Frequency Ordered Stop   07/02/19 1530  ceFAZolin (ANCEF) IVPB 2g/100 mL premix     2 g 200 mL/hr over 30 Minutes Intravenous Every 6 hours 07/02/19 1232 07/02/19 2203   07/02/19 1033  cefUROXime (ZINACEF) injection  Status:  Discontinued       As needed 07/02/19 1033 07/02/19 1122   07/02/19 0730  ceFAZolin (ANCEF) IVPB 2g/100 mL premix     2 g 200 mL/hr over 30 Minutes Intravenous On call to O.R. 07/02/19 9381 07/02/19 0956       Patient was given sequential compression devices, early ambulation, and chemoprophylaxis to prevent DVT.  POST  OP DAY ONE PATIENT HAD A SYNCOPAL EPISODE WITH HYPOTENSION WITH ME,  SHE WAS GIVEN 2 UNITS OF BLOOD FOR HER POST OP BLOOD LAST ANEMIA AND A BOLUS OF FLUID.  SHE DID BETTER WITH PHYSICAL THERAPY.  READY FOR DISCHARGE POST OP DAY TWO.  PATIENT WAS CHANGED FROM OUTPATIENT TO INPATIENT DUE TO IT BEING MEDICALLY NECESSARY TO STAY IN THE HOSPITAL FOR TWO MIDNIGHTS BECAUSE OF HER SYNCOPAL EPISODE AND BLOOD TRANSFUSION  Patient benefited maximally from hospital stay and there were no complications.    Recent vital signs:  Patient Vitals for the past 24 hrs:  BP Temp Temp src Pulse Resp SpO2  07/04/19 1032 (!) 112/56 97.6 F (36.4 C) Oral 80 16 99 %  07/04/19 0651 129/79 97.7 F (36.5 C) Oral 93 16 100 %  07/04/19 0205 (!) 121/58 97.9 F (36.6 C) - 85 16 97 %  07/03/19 2118 134/69 98.5 F (36.9 C) Oral 92 16 100 %  07/03/19 1821 (!) 145/69 98.4 F (36.9 C) Oral 98 14 100 %  07/03/19 1750 129/65 98.3 F (36.8 C) Oral 74 16 99 %  07/03/19 1342 114/65 98 F (36.7 C) Oral 95 16 100 %     Recent laboratory studies:  Recent Labs    07/03/19 0240  07/03/19 2230 07/04/19 0244  WBC 12.3*   < > 12.5* 12.0*  HGB 8.7*   < > 11.0* 10.7*  HCT 27.3*   < > 33.0* 32.8*  PLT 228   < > 217  232  NA 131*  --   --  137  K 4.5  --   --  4.2  CL 102  --   --  104  CO2 22  --   --  25  BUN 13  --   --  14  CREATININE 0.55  --   --  0.53  GLUCOSE 172*  --   --  163*  CALCIUM 9.1  --   --  9.5   < > = values in this interval not displayed.     Discharge Medications:   Allergies as of 07/04/2019      Reactions   Codeine Nausea And Vomiting   Flu Virus Vaccine    Flu like symptoms   Pneumococcal Vaccines    Swollen arm      Medication List    STOP taking these medications   aspirin 81 MG tablet Replaced by: aspirin 325 MG EC tablet   ibuprofen 200 MG tablet Commonly known as: ADVIL   Turmeric 450 MG Caps     TAKE these medications   ALPRAZolam 0.25 MG tablet Commonly known as:  XANAX Take 1 tablet (0.25 mg total) by mouth 3 (three) times daily as needed for anxiety.   aspirin 325 MG EC tablet 1 tab a day for the next 30 days to prevent blood clots Replaces: aspirin 81 MG tablet   Biotin 1000 MCG tablet Take 1,000 mcg by mouth daily.   Cranberry 500 MG Caps Take 500 mg by mouth daily.   diphenhydrAMINE 25 MG tablet Commonly known as: BENADRYL Take 25 mg by mouth at bedtime.   docusate sodium 100 MG capsule Commonly known as: COLACE 1 tab 2 times a day while on narcotics.  STOOL SOFTENER   Emergen-C Immune Plus Pack Take 1 packet by mouth daily.   gabapentin 300 MG capsule Commonly known as: NEURONTIN Take 1 capsule (300 mg total) by mouth at bedtime.   omeprazole 10 MG capsule Commonly known as: PRILOSEC Take 10 mg by mouth daily.   oxyCODONE 5 MG immediate release tablet Commonly known as: Oxy IR/ROXICODONE Take 1-2 tablets (5-10 mg total) by mouth every 4 (four) hours as needed for moderate pain (pain score 4-6).   polyethylene glycol 17 g packet Commonly known as: MIRALAX / GLYCOLAX 17grams in 6 oz of something to drink twice a day until bowel movement.  LAXITIVE.  Restart if two days since last bowel movement   spironolactone 100 MG tablet Commonly known as: ALDACTONE TAKE 1 TABLET BY MOUTH  DAILY   vitamin B-12 1000 MCG tablet Commonly known as: CYANOCOBALAMIN Take 1,000 mcg by mouth daily.   Vitamin D 50 MCG (2000 UT) tablet Take 2,000 Units by mouth daily.            Discharge Care Instructions  (From admission, onward)         Start     Ordered   07/04/19 0000  Change dressing    Comments: DO NOT REMOVE BANDAGE OVER SURGICAL INCISION.  [place] WHOLE LEG INCLUDING OVER THE WATERPROOF BANDAGE WITH SOAP AND WATER EVERY DAY.   07/04/19 0802          Diagnostic Studies: No results found.  Disposition: Discharge disposition: 01-Home or Self Care       Discharge Instructions    CPM   Complete by: As directed     Continuous passive motion machine (CPM):      Use the CPM from 0 to 90  for 6 hours per day.       You may break it up into 2 or 3 sessions per day.      Use CPM for 2 weeks or until you are told to stop.   Call MD / Call 911   Complete by: As directed    If you experience chest pain or shortness of breath, CALL 911 and be transported to the hospital emergency room.  If you develope a fever above 101 F, pus (white drainage) or increased drainage or redness at the wound, or calf pain, call your surgeon's office.   Change dressing   Complete by: As directed    DO NOT REMOVE BANDAGE OVER SURGICAL INCISION.  [place] WHOLE LEG INCLUDING OVER THE WATERPROOF BANDAGE WITH SOAP AND WATER EVERY DAY.   Constipation Prevention   Complete by: As directed    Drink plenty of fluids.  Prune juice may be helpful.  You may use a stool softener, such as Colace (over the counter) 100 mg twice a day.  Use MiraLax (over the counter) for constipation as needed.   Diet - low sodium heart healthy   Complete by: As directed    Discharge instructions   Complete by: As directed    INSTRUCTIONS AFTER JOINT REPLACEMENT   DO NOT TAKE IBUPROFEN OR TUMERIC FOR THE FIRST TWO WEEKS AFTER SURGERY   OK TO RESUME THESE MEDICATIONS AFTER 07/16/2019  Remove items at home which could result in a fall. This includes throw rugs or furniture in walking pathways ICE to the affected joint every three hours while awake for 30 minutes at a time, for at least the first 3-5 days, and then as needed for pain and swelling.  Continue to use ice for pain and swelling. You may notice swelling that will progress down to the foot and ankle.  This is normal after surgery.  Elevate your leg when you are not up walking on it.   Continue to use the breathing machine you got in the hospital (incentive spirometer) which will help keep your temperature down.  It is common for your temperature to cycle up and down following surgery, especially at night when you  are not up moving around and exerting yourself.  The breathing machine keeps your lungs expanded and your temperature down.   DIET:  As you were doing prior to hospitalization, we recommend a well-balanced diet.  DRESSING / WOUND CARE / SHOWERING  Keep the surgical dressing until follow up.  The dressing is water proof, so you can shower without any extra covering.  IF THE DRESSING FALLS OFF or the wound gets wet inside, change the dressing with sterile gauze.  Please use good hand washing techniques before changing the dressing.  Do not use any lotions or creams on the incision until instructed by your surgeon.    ACTIVITY  Increase activity slowly as tolerated, but follow the weight bearing instructions below.   No driving for 6 weeks or until further direction given by your physician.  You cannot drive while taking narcotics.  No lifting or carrying greater than 10 lbs. until further directed by your surgeon. Avoid periods of inactivity such as sitting longer than an hour when not asleep. This helps prevent blood clots.  You may return to work once you are authorized by your doctor.     WEIGHT BEARING   Weight bearing as tolerated with assist device (walker, cane, etc) as directed, use it as long as suggested by  your surgeon or therapist, typically at least 2-3 weeks.   EXERCISES  Results after joint replacement surgery are often greatly improved when you follow the exercise, range of motion and muscle strengthening exercises prescribed by your doctor. Safety measures are also important to protect the joint from further injury. Any time any of these exercises cause you to have increased pain or swelling, decrease what you are doing until you are comfortable again and then slowly increase them. If you have problems or questions, call your caregiver or physical therapist for advice.   Rehabilitation is important following a joint replacement. After just a few days of immobilization, the  muscles of the leg can become weakened and shrink (atrophy).  These exercises are designed to build up the tone and strength of the thigh and leg muscles and to improve motion. Often times heat used for twenty to thirty minutes before working out will loosen up your tissues and help with improving the range of motion but do not use heat for the first two weeks following surgery (sometimes heat can increase post-operative swelling).   These exercises can be done on a training (exercise) mat, on the floor, on a table or on a bed. Use whatever works the best and is most comfortable for you.    Use music or television while you are exercising so that the exercises are a pleasant break in your day. This will make your life better with the exercises acting as a break in your routine that you can look forward to.   Perform all exercises about fifteen times, three times per day or as directed.  You should exercise both the operative leg and the other leg as well.   Exercises include:  Quad Sets - Tighten up the muscle on the front of the thigh (Quad) and hold for 5-10 seconds.   Straight Leg Raises - With your knee straight (if you were given a brace, keep it on), lift the leg to 60 degrees, hold for 3 seconds, and slowly lower the leg.  Perform this exercise against resistance later as your leg gets stronger.  Leg Slides: Lying on your back, slowly slide your foot toward your buttocks, bending your knee up off the floor (only go as far as is comfortable). Then slowly slide your foot back down until your leg is flat on the floor again.  Angel Wings: Lying on your back spread your legs to the side as far apart as you can without causing discomfort.  Hamstring Strength:  Lying on your back, push your heel against the floor with your leg straight by tightening up the muscles of your buttocks.  Repeat, but this time bend your knee to a comfortable angle, and push your heel against the floor.  You may put a pillow  under the heel to make it more comfortable if necessary.   A rehabilitation program following joint replacement surgery can speed recovery and prevent re-injury in the future due to weakened muscles. Contact your doctor or a physical therapist for more information on knee rehabilitation.    CONSTIPATION  Constipation is defined medically as fewer than three stools per week and severe constipation as less than one stool per week.  Even if you have a regular bowel pattern at home, your normal regimen is likely to be disrupted due to multiple reasons following surgery.  Combination of anesthesia, postoperative narcotics, change in appetite and fluid intake all can affect your bowels.   YOU MUST use at least  one of the following options; they are listed in order of increasing strength to get the job done.  They are all available over the counter, and you may need to use some, POSSIBLY even all of these options:    Drink plenty of fluids (prune juice may be helpful) and high fiber foods Colace 100 mg by mouth twice a day  Senokot for constipation as directed and as needed Dulcolax (bisacodyl), take with full glass of water  Miralax (polyethylene glycol) once or twice a day as needed.  If you have tried all these things and are unable to have a bowel movement in the first 3-4 days after surgery call either your surgeon or your primary doctor.    If you experience loose stools or diarrhea, hold the medications until you stool forms back up.  If your symptoms do not get better within 1 week or if they get worse, check with your doctor.  If you experience "the worst abdominal pain ever" or develop nausea or vomiting, please contact the office immediately for further recommendations for treatment.   ITCHING:  If you experience itching with your medications, try taking only a single pain pill, or even half a pain pill at a time.  You can also use Benadryl over the counter for itching or also to help with  sleep.   TED HOSE STOCKINGS:  Use stockings on both legs until for at least 2 weeks or as directed by physician office. They may be removed at night for sleeping.  MEDICATIONS:  See your medication summary on the "After Visit Summary" that nursing will review with you.  You may have some home medications which will be placed on hold until you complete the course of blood thinner medication.  It is important for you to complete the blood thinner medication as prescribed.  PRECAUTIONS:  If you experience chest pain or shortness of breath - call 911 immediately for transfer to the hospital emergency department.   If you develop a fever greater that 101 F, purulent drainage from wound, increased redness or drainage from wound, foul odor from the wound/dressing, or calf pain - CONTACT YOUR SURGEON.                                                   FOLLOW-UP APPOINTMENTS:  If you do not already have a post-op appointment, please call the office for an appointment to be seen by your surgeon.  Guidelines for how soon to be seen are listed in your "After Visit Summary", but are typically between 1-4 weeks after surgery.  OTHER INSTRUCTIONS:   Knee Replacement:  Do not place pillow under knee, focus on keeping the knee straight while resting. CPM instructions: 0-90 degrees, 2 hours in the morning, 2 hours in the afternoon, and 2 hours in the evening. Place foam block, curve side up under heel at all times except when in CPM or when walking.  DO NOT modify, tear, cut, or change the foam block in any way.  MAKE SURE YOU:  Understand these instructions.  Get help right away if you are not doing well or get worse.    Thank you for letting us be a part of your medical care team.  It is a privilege we respect greatly.  We hope these instructions will help you stay on track  for a fast and full recovery!   Do not put a pillow under the knee. Place it under the heel.   Complete by: As directed    Place gray  foam block, curve side up under heel at all times except when in CPM or when walking.  DO NOT modify, tear, cut, or change in any way the gray foam block.   Increase activity slowly as tolerated   Complete by: As directed    Patient may shower   Complete by: As directed    Aquacel dressing is water proof    Wash over it and the whole leg with soap and water at the end of your shower   TED hose   Complete by: As directed    Use stockings (TED hose) for 2 weeks on both leg(s).  You may remove them at night for sleeping.         Signed: Linda Hedges 07/04/2019, 10:50 AM

## 2019-07-04 NOTE — Progress Notes (Signed)
Physical Therapy Treatment Patient Details Name: Maria Bautista MRN: [id number] DOB: 01-12-1951 Today's Date: 07/04/2019    History of Present Illness 68 yo female s/p L TKA 8/10    PT Comments    Progressing well with mobility. Reviewed exercises, gait training, and stair training. All education completed. Okay to d/c from PT standpoint.    Follow Up Recommendations  Follow surgeon's recommendation for DC plan and follow-up therapies     Equipment Recommendations  None recommended by PT    Recommendations for Other Services       Precautions / Restrictions Precautions Precautions: Knee;Fall Restrictions Weight Bearing Restrictions: No Other Position/Activity Restrictions: WBAT    Mobility  Bed Mobility               General bed mobility comments: oob in recliner  Transfers Overall transfer level: Needs assistance Equipment used: Rolling walker (2 wheeled) Transfers: Sit to/from Stand Sit to Stand: Supervision         General transfer comment: for safety. VCs hand placement  Ambulation/Gait Ambulation/Gait assistance: Min guard Gait Distance (Feet): 150 Feet(x2) Assistive device: Rolling walker (2 wheeled) Gait Pattern/deviations: Step-through pattern     General Gait Details: VCs safety, technique, sequence. Pt tolerated distance well. No c/o lightheadedness.   Stairs Stairs: Yes Stairs assistance: Min guard Stair Management: Forwards;One rail Right;With cane;Step to pattern Number of Stairs: 5 General stair comments: up and over gym stairs x 2. VCs safety, technique, sequence.   Wheelchair Mobility    Modified Rankin (Stroke Patients Only)       Balance Overall balance assessment: Needs assistance         Standing balance support: Bilateral upper extremity supported Standing balance-Leahy Scale: Poor                              Cognition Arousal/Alertness: Awake/alert Behavior During Therapy: WFL for tasks  assessed/performed Overall Cognitive Status: Within Functional Limits for tasks assessed                                        Exercises Total Joint Exercises Ankle Circles/Pumps: AROM;Both;10 reps;Seated Quad Sets: AROM;Left;10 reps;Seated Hip ABduction/ADduction: AROM;Left;10 reps;Supine Straight Leg Raises: AROM;AAROM;Left;10 reps;Supine Knee Flexion: AAROM;Left;10 reps;Seated Goniometric ROM: ~10-80 degrees    General Comments        Pertinent Vitals/Pain Pain Assessment: 0-10 Pain Score: 5  Pain Location: L knee Pain Descriptors / Indicators: Aching;Sore Pain Intervention(s): Monitored during session    Home Living                      Prior Function            PT Goals (current goals can now be found in the care plan section) Progress towards PT goals: Progressing toward goals    Frequency    7X/week      PT Plan Current plan remains appropriate    Co-evaluation              AM-PAC PT "6 Clicks" Mobility   Outcome Measure  Help needed turning from your back to your side while in a flat bed without using bedrails?: A Little Help needed moving from lying on your back to sitting on the side of a flat bed without using bedrails?: A Little Help needed moving to and from a bed  to a chair (including a wheelchair)?: A Little Help needed standing up from a chair using your arms (e.g., wheelchair or bedside chair)?: A Little Help needed to walk in hospital room?: A Little Help needed climbing 3-5 steps with a railing? : A Little 6 Click Score: 18    End of Session Equipment Utilized During Treatment: Gait belt Activity Tolerance: Patient tolerated treatment well Patient left: in chair;with call bell/phone within reach;with family/visitor present   PT Visit Diagnosis: Other abnormalities of gait and mobility (R26.89);Pain Pain - Right/Left: Left Pain - part of body: Knee     Time: 7282-0601 PT Time Calculation (min)  (ACUTE ONLY): 25 min  Charges:  $Gait Training: 8-22 mins $Therapeutic Exercise: 8-22 mins                        Weston Anna, PT Acute Rehabilitation Services Pager: [phone] Office: [phone]

## 2019-07-06 DIAGNOSIS — Z471 Aftercare following joint replacement surgery: Secondary | ICD-10-CM | POA: Diagnosis not present

## 2019-07-06 DIAGNOSIS — Z96652 Presence of left artificial knee joint: Secondary | ICD-10-CM | POA: Diagnosis not present

## 2019-07-06 DIAGNOSIS — Z7982 Long term (current) use of aspirin: Secondary | ICD-10-CM | POA: Diagnosis not present

## 2019-07-06 DIAGNOSIS — M8580 Other specified disorders of bone density and structure, unspecified site: Secondary | ICD-10-CM | POA: Diagnosis not present

## 2019-07-06 DIAGNOSIS — I10 Essential (primary) hypertension: Secondary | ICD-10-CM | POA: Diagnosis not present

## 2019-07-06 DIAGNOSIS — Z9181 History of falling: Secondary | ICD-10-CM | POA: Diagnosis not present

## 2019-07-06 DIAGNOSIS — K219 Gastro-esophageal reflux disease without esophagitis: Secondary | ICD-10-CM | POA: Diagnosis not present

## 2019-07-06 DIAGNOSIS — E039 Hypothyroidism, unspecified: Secondary | ICD-10-CM | POA: Diagnosis not present

## 2019-07-09 DIAGNOSIS — E039 Hypothyroidism, unspecified: Secondary | ICD-10-CM | POA: Diagnosis not present

## 2019-07-09 DIAGNOSIS — Z471 Aftercare following joint replacement surgery: Secondary | ICD-10-CM | POA: Diagnosis not present

## 2019-07-09 DIAGNOSIS — K219 Gastro-esophageal reflux disease without esophagitis: Secondary | ICD-10-CM | POA: Diagnosis not present

## 2019-07-09 DIAGNOSIS — M8580 Other specified disorders of bone density and structure, unspecified site: Secondary | ICD-10-CM | POA: Diagnosis not present

## 2019-07-09 DIAGNOSIS — Z96652 Presence of left artificial knee joint: Secondary | ICD-10-CM | POA: Diagnosis not present

## 2019-07-09 DIAGNOSIS — Z7982 Long term (current) use of aspirin: Secondary | ICD-10-CM | POA: Diagnosis not present

## 2019-07-09 DIAGNOSIS — I10 Essential (primary) hypertension: Secondary | ICD-10-CM | POA: Diagnosis not present

## 2019-07-09 DIAGNOSIS — Z9181 History of falling: Secondary | ICD-10-CM | POA: Diagnosis not present

## 2019-07-11 DIAGNOSIS — Z96652 Presence of left artificial knee joint: Secondary | ICD-10-CM | POA: Diagnosis not present

## 2019-07-11 DIAGNOSIS — Z9181 History of falling: Secondary | ICD-10-CM | POA: Diagnosis not present

## 2019-07-11 DIAGNOSIS — E039 Hypothyroidism, unspecified: Secondary | ICD-10-CM | POA: Diagnosis not present

## 2019-07-11 DIAGNOSIS — K219 Gastro-esophageal reflux disease without esophagitis: Secondary | ICD-10-CM | POA: Diagnosis not present

## 2019-07-11 DIAGNOSIS — M8580 Other specified disorders of bone density and structure, unspecified site: Secondary | ICD-10-CM | POA: Diagnosis not present

## 2019-07-11 DIAGNOSIS — Z7982 Long term (current) use of aspirin: Secondary | ICD-10-CM | POA: Diagnosis not present

## 2019-07-11 DIAGNOSIS — I10 Essential (primary) hypertension: Secondary | ICD-10-CM | POA: Diagnosis not present

## 2019-07-11 DIAGNOSIS — Z471 Aftercare following joint replacement surgery: Secondary | ICD-10-CM | POA: Diagnosis not present

## 2019-07-12 DIAGNOSIS — E039 Hypothyroidism, unspecified: Secondary | ICD-10-CM | POA: Diagnosis not present

## 2019-07-12 DIAGNOSIS — Z9181 History of falling: Secondary | ICD-10-CM | POA: Diagnosis not present

## 2019-07-12 DIAGNOSIS — K219 Gastro-esophageal reflux disease without esophagitis: Secondary | ICD-10-CM | POA: Diagnosis not present

## 2019-07-12 DIAGNOSIS — M8580 Other specified disorders of bone density and structure, unspecified site: Secondary | ICD-10-CM | POA: Diagnosis not present

## 2019-07-12 DIAGNOSIS — Z7982 Long term (current) use of aspirin: Secondary | ICD-10-CM | POA: Diagnosis not present

## 2019-07-12 DIAGNOSIS — I10 Essential (primary) hypertension: Secondary | ICD-10-CM | POA: Diagnosis not present

## 2019-07-12 DIAGNOSIS — Z96652 Presence of left artificial knee joint: Secondary | ICD-10-CM | POA: Diagnosis not present

## 2019-07-12 DIAGNOSIS — Z471 Aftercare following joint replacement surgery: Secondary | ICD-10-CM | POA: Diagnosis not present

## 2019-07-16 DIAGNOSIS — Z471 Aftercare following joint replacement surgery: Secondary | ICD-10-CM | POA: Diagnosis not present

## 2019-07-16 DIAGNOSIS — E039 Hypothyroidism, unspecified: Secondary | ICD-10-CM | POA: Diagnosis not present

## 2019-07-16 DIAGNOSIS — I10 Essential (primary) hypertension: Secondary | ICD-10-CM | POA: Diagnosis not present

## 2019-07-16 DIAGNOSIS — M8580 Other specified disorders of bone density and structure, unspecified site: Secondary | ICD-10-CM | POA: Diagnosis not present

## 2019-07-16 DIAGNOSIS — Z96652 Presence of left artificial knee joint: Secondary | ICD-10-CM | POA: Diagnosis not present

## 2019-07-16 DIAGNOSIS — K219 Gastro-esophageal reflux disease without esophagitis: Secondary | ICD-10-CM | POA: Diagnosis not present

## 2019-07-16 DIAGNOSIS — Z9181 History of falling: Secondary | ICD-10-CM | POA: Diagnosis not present

## 2019-07-16 DIAGNOSIS — Z7982 Long term (current) use of aspirin: Secondary | ICD-10-CM | POA: Diagnosis not present

## 2019-07-17 DIAGNOSIS — M1712 Unilateral primary osteoarthritis, left knee: Secondary | ICD-10-CM | POA: Diagnosis not present

## 2019-07-18 DIAGNOSIS — R262 Difficulty in walking, not elsewhere classified: Secondary | ICD-10-CM | POA: Diagnosis not present

## 2019-07-18 DIAGNOSIS — M6281 Muscle weakness (generalized): Secondary | ICD-10-CM | POA: Diagnosis not present

## 2019-07-18 DIAGNOSIS — M1712 Unilateral primary osteoarthritis, left knee: Secondary | ICD-10-CM | POA: Diagnosis not present

## 2019-07-18 DIAGNOSIS — M25662 Stiffness of left knee, not elsewhere classified: Secondary | ICD-10-CM | POA: Diagnosis not present

## 2019-07-20 DIAGNOSIS — M25662 Stiffness of left knee, not elsewhere classified: Secondary | ICD-10-CM | POA: Diagnosis not present

## 2019-07-20 DIAGNOSIS — R262 Difficulty in walking, not elsewhere classified: Secondary | ICD-10-CM | POA: Diagnosis not present

## 2019-07-20 DIAGNOSIS — M1712 Unilateral primary osteoarthritis, left knee: Secondary | ICD-10-CM | POA: Diagnosis not present

## 2019-07-20 DIAGNOSIS — M6281 Muscle weakness (generalized): Secondary | ICD-10-CM | POA: Diagnosis not present

## 2019-07-23 DIAGNOSIS — M1712 Unilateral primary osteoarthritis, left knee: Secondary | ICD-10-CM | POA: Diagnosis not present

## 2019-07-23 DIAGNOSIS — M25662 Stiffness of left knee, not elsewhere classified: Secondary | ICD-10-CM | POA: Diagnosis not present

## 2019-07-23 DIAGNOSIS — R262 Difficulty in walking, not elsewhere classified: Secondary | ICD-10-CM | POA: Diagnosis not present

## 2019-07-23 DIAGNOSIS — M6281 Muscle weakness (generalized): Secondary | ICD-10-CM | POA: Diagnosis not present

## 2019-07-25 DIAGNOSIS — M6281 Muscle weakness (generalized): Secondary | ICD-10-CM | POA: Diagnosis not present

## 2019-07-25 DIAGNOSIS — M1712 Unilateral primary osteoarthritis, left knee: Secondary | ICD-10-CM | POA: Diagnosis not present

## 2019-07-25 DIAGNOSIS — R262 Difficulty in walking, not elsewhere classified: Secondary | ICD-10-CM | POA: Diagnosis not present

## 2019-07-25 DIAGNOSIS — M25662 Stiffness of left knee, not elsewhere classified: Secondary | ICD-10-CM | POA: Diagnosis not present

## 2019-07-27 DIAGNOSIS — M1712 Unilateral primary osteoarthritis, left knee: Secondary | ICD-10-CM | POA: Diagnosis not present

## 2019-07-27 DIAGNOSIS — M25662 Stiffness of left knee, not elsewhere classified: Secondary | ICD-10-CM | POA: Diagnosis not present

## 2019-07-27 DIAGNOSIS — M6281 Muscle weakness (generalized): Secondary | ICD-10-CM | POA: Diagnosis not present

## 2019-07-27 DIAGNOSIS — R262 Difficulty in walking, not elsewhere classified: Secondary | ICD-10-CM | POA: Diagnosis not present

## 2019-08-01 DIAGNOSIS — R262 Difficulty in walking, not elsewhere classified: Secondary | ICD-10-CM | POA: Diagnosis not present

## 2019-08-01 DIAGNOSIS — M25662 Stiffness of left knee, not elsewhere classified: Secondary | ICD-10-CM | POA: Diagnosis not present

## 2019-08-01 DIAGNOSIS — M6281 Muscle weakness (generalized): Secondary | ICD-10-CM | POA: Diagnosis not present

## 2019-08-01 DIAGNOSIS — M1712 Unilateral primary osteoarthritis, left knee: Secondary | ICD-10-CM | POA: Diagnosis not present

## 2019-08-03 DIAGNOSIS — M25662 Stiffness of left knee, not elsewhere classified: Secondary | ICD-10-CM | POA: Diagnosis not present

## 2019-08-03 DIAGNOSIS — M1712 Unilateral primary osteoarthritis, left knee: Secondary | ICD-10-CM | POA: Diagnosis not present

## 2019-08-03 DIAGNOSIS — M6281 Muscle weakness (generalized): Secondary | ICD-10-CM | POA: Diagnosis not present

## 2019-08-03 DIAGNOSIS — R262 Difficulty in walking, not elsewhere classified: Secondary | ICD-10-CM | POA: Diagnosis not present

## 2019-08-06 DIAGNOSIS — R262 Difficulty in walking, not elsewhere classified: Secondary | ICD-10-CM | POA: Diagnosis not present

## 2019-08-06 DIAGNOSIS — M1712 Unilateral primary osteoarthritis, left knee: Secondary | ICD-10-CM | POA: Diagnosis not present

## 2019-08-06 DIAGNOSIS — M25662 Stiffness of left knee, not elsewhere classified: Secondary | ICD-10-CM | POA: Diagnosis not present

## 2019-08-06 DIAGNOSIS — M6281 Muscle weakness (generalized): Secondary | ICD-10-CM | POA: Diagnosis not present

## 2019-08-08 DIAGNOSIS — M6281 Muscle weakness (generalized): Secondary | ICD-10-CM | POA: Diagnosis not present

## 2019-08-08 DIAGNOSIS — M1712 Unilateral primary osteoarthritis, left knee: Secondary | ICD-10-CM | POA: Diagnosis not present

## 2019-08-08 DIAGNOSIS — R262 Difficulty in walking, not elsewhere classified: Secondary | ICD-10-CM | POA: Diagnosis not present

## 2019-08-08 DIAGNOSIS — M25662 Stiffness of left knee, not elsewhere classified: Secondary | ICD-10-CM | POA: Diagnosis not present

## 2019-08-10 DIAGNOSIS — M6281 Muscle weakness (generalized): Secondary | ICD-10-CM | POA: Diagnosis not present

## 2019-08-10 DIAGNOSIS — M1712 Unilateral primary osteoarthritis, left knee: Secondary | ICD-10-CM | POA: Diagnosis not present

## 2019-08-10 DIAGNOSIS — R262 Difficulty in walking, not elsewhere classified: Secondary | ICD-10-CM | POA: Diagnosis not present

## 2019-08-10 DIAGNOSIS — M25662 Stiffness of left knee, not elsewhere classified: Secondary | ICD-10-CM | POA: Diagnosis not present

## 2019-08-13 DIAGNOSIS — M6281 Muscle weakness (generalized): Secondary | ICD-10-CM | POA: Diagnosis not present

## 2019-08-13 DIAGNOSIS — M1712 Unilateral primary osteoarthritis, left knee: Secondary | ICD-10-CM | POA: Diagnosis not present

## 2019-08-13 DIAGNOSIS — M25662 Stiffness of left knee, not elsewhere classified: Secondary | ICD-10-CM | POA: Diagnosis not present

## 2019-08-13 DIAGNOSIS — R262 Difficulty in walking, not elsewhere classified: Secondary | ICD-10-CM | POA: Diagnosis not present

## 2019-08-17 DIAGNOSIS — M25662 Stiffness of left knee, not elsewhere classified: Secondary | ICD-10-CM | POA: Diagnosis not present

## 2019-08-17 DIAGNOSIS — R262 Difficulty in walking, not elsewhere classified: Secondary | ICD-10-CM | POA: Diagnosis not present

## 2019-08-17 DIAGNOSIS — M6281 Muscle weakness (generalized): Secondary | ICD-10-CM | POA: Diagnosis not present

## 2019-08-17 DIAGNOSIS — M1712 Unilateral primary osteoarthritis, left knee: Secondary | ICD-10-CM | POA: Diagnosis not present

## 2019-08-20 DIAGNOSIS — M1712 Unilateral primary osteoarthritis, left knee: Secondary | ICD-10-CM | POA: Diagnosis not present

## 2019-08-20 DIAGNOSIS — M6281 Muscle weakness (generalized): Secondary | ICD-10-CM | POA: Diagnosis not present

## 2019-08-20 DIAGNOSIS — M25662 Stiffness of left knee, not elsewhere classified: Secondary | ICD-10-CM | POA: Diagnosis not present

## 2019-08-20 DIAGNOSIS — R262 Difficulty in walking, not elsewhere classified: Secondary | ICD-10-CM | POA: Diagnosis not present

## 2019-09-05 ENCOUNTER — Ambulatory Visit
Admission: RE | Admit: 2019-09-05 | Discharge: 2019-09-05 | Disposition: A | Discharge status: Home / Self Care | Payer: Medicare Other | Source: Ambulatory Visit | Attending: Family Medicine | Admitting: Family Medicine

## 2019-09-05 ENCOUNTER — Other Ambulatory Visit: Payer: Self-pay

## 2019-09-05 DIAGNOSIS — M8580 Other specified disorders of bone density and structure, unspecified site: Secondary | ICD-10-CM

## 2019-09-05 DIAGNOSIS — Z780 Asymptomatic menopausal state: Secondary | ICD-10-CM | POA: Diagnosis not present

## 2019-09-05 DIAGNOSIS — M8589 Other specified disorders of bone density and structure, multiple sites: Secondary | ICD-10-CM | POA: Diagnosis not present

## 2019-09-06 ENCOUNTER — Telehealth: Payer: Self-pay | Admitting: Family Medicine

## 2019-09-06 NOTE — Telephone Encounter (Signed)
Patient is calling and states Serena Colonel contacted her yesterday about her bone density results and she is available by phone to talk today after 2pm.

## 2019-09-06 NOTE — Telephone Encounter (Signed)
Patient is a patient of Dr Nolon Rod at [facility]

## 2019-10-02 DIAGNOSIS — Z96652 Presence of left artificial knee joint: Secondary | ICD-10-CM | POA: Diagnosis not present

## 2019-11-08 ENCOUNTER — Other Ambulatory Visit: Payer: Self-pay | Admitting: Family Medicine

## 2019-11-08 DIAGNOSIS — L659 Nonscarring hair loss, unspecified: Secondary | ICD-10-CM

## 2019-11-08 NOTE — Telephone Encounter (Signed)
Forwarding medication refill request to the clinical pool for review. 

## 2019-12-11 DIAGNOSIS — H2513 Age-related nuclear cataract, bilateral: Secondary | ICD-10-CM | POA: Diagnosis not present

## 2020-01-01 DIAGNOSIS — Z96652 Presence of left artificial knee joint: Secondary | ICD-10-CM | POA: Diagnosis not present

## 2020-01-29 DIAGNOSIS — J301 Allergic rhinitis due to pollen: Secondary | ICD-10-CM | POA: Diagnosis not present

## 2020-01-29 DIAGNOSIS — L659 Nonscarring hair loss, unspecified: Secondary | ICD-10-CM | POA: Diagnosis not present

## 2020-01-29 DIAGNOSIS — Z8639 Personal history of other endocrine, nutritional and metabolic disease: Secondary | ICD-10-CM | POA: Diagnosis not present

## 2020-01-29 DIAGNOSIS — Z7189 Other specified counseling: Secondary | ICD-10-CM | POA: Diagnosis not present

## 2020-02-19 ENCOUNTER — Other Ambulatory Visit: Payer: Self-pay | Admitting: Family Medicine

## 2020-02-19 DIAGNOSIS — L659 Nonscarring hair loss, unspecified: Secondary | ICD-10-CM

## 2020-02-19 NOTE — Telephone Encounter (Signed)
Requested medication (s) are due for refill today: yes  Requested medication (s) are on the active medication list: yes  Last refill:  12/19/19  Future visit scheduled: no  Notes to clinic:  no assigned protocol; no valid encounter within last 6 months   Requested Prescriptions  Pending Prescriptions Disp Refills   spironolactone (ALDACTONE) 100 MG tablet [Pharmacy Med Name: SPIRONOLACTONE  100MG   TAB] 90 tablet 3    Sig: TAKE 1 TABLET BY MOUTH  DAILY      There is no refill protocol information for this order

## 2020-03-31 ENCOUNTER — Other Ambulatory Visit: Payer: Self-pay | Admitting: Family Medicine

## 2020-03-31 DIAGNOSIS — Z1231 Encounter for screening mammogram for malignant neoplasm of breast: Secondary | ICD-10-CM

## 2020-04-15 DIAGNOSIS — L821 Other seborrheic keratosis: Secondary | ICD-10-CM | POA: Diagnosis not present

## 2020-04-15 DIAGNOSIS — L814 Other melanin hyperpigmentation: Secondary | ICD-10-CM | POA: Diagnosis not present

## 2020-04-15 DIAGNOSIS — L723 Sebaceous cyst: Secondary | ICD-10-CM | POA: Diagnosis not present

## 2020-04-15 DIAGNOSIS — L570 Actinic keratosis: Secondary | ICD-10-CM | POA: Diagnosis not present

## 2020-04-15 DIAGNOSIS — L578 Other skin changes due to chronic exposure to nonionizing radiation: Secondary | ICD-10-CM | POA: Diagnosis not present

## 2020-05-06 ENCOUNTER — Other Ambulatory Visit: Payer: Self-pay

## 2020-05-06 ENCOUNTER — Ambulatory Visit
Admission: RE | Admit: 2020-05-06 | Discharge: 2020-05-06 | Disposition: A | Discharge status: Home / Self Care | Payer: Medicare Other | Source: Ambulatory Visit | Attending: Family Medicine | Admitting: Family Medicine

## 2020-05-06 DIAGNOSIS — Z1231 Encounter for screening mammogram for malignant neoplasm of breast: Secondary | ICD-10-CM | POA: Diagnosis not present

## 2020-06-10 DIAGNOSIS — H2513 Age-related nuclear cataract, bilateral: Secondary | ICD-10-CM | POA: Diagnosis not present

## 2020-08-07 ENCOUNTER — Other Ambulatory Visit: Payer: Self-pay | Admitting: Family Medicine

## 2020-08-07 DIAGNOSIS — Z8639 Personal history of other endocrine, nutritional and metabolic disease: Secondary | ICD-10-CM

## 2020-08-14 ENCOUNTER — Ambulatory Visit
Admission: RE | Admit: 2020-08-14 | Discharge: 2020-08-14 | Disposition: A | Discharge status: Home / Self Care | Payer: Medicare Other | Source: Ambulatory Visit | Attending: Family Medicine | Admitting: Family Medicine

## 2020-08-14 DIAGNOSIS — Z8639 Personal history of other endocrine, nutritional and metabolic disease: Secondary | ICD-10-CM

## 2020-08-19 ENCOUNTER — Other Ambulatory Visit: Payer: Self-pay | Admitting: Family Medicine

## 2020-08-19 DIAGNOSIS — E041 Nontoxic single thyroid nodule: Secondary | ICD-10-CM

## 2020-08-28 ENCOUNTER — Ambulatory Visit
Admission: RE | Admit: 2020-08-28 | Discharge: 2020-08-28 | Disposition: A | Discharge status: Home / Self Care | Payer: Medicare Other | Source: Ambulatory Visit | Attending: Family Medicine | Admitting: Family Medicine

## 2020-08-28 ENCOUNTER — Other Ambulatory Visit (HOSPITAL_COMMUNITY)
Admission: RE | Admit: 2020-08-28 | Discharge: 2020-08-28 | Disposition: A | Discharge status: Home / Self Care | Payer: Medicare Other | Source: Ambulatory Visit | Attending: Interventional Radiology | Admitting: Interventional Radiology

## 2020-08-28 DIAGNOSIS — E042 Nontoxic multinodular goiter: Secondary | ICD-10-CM | POA: Diagnosis present

## 2020-08-28 DIAGNOSIS — D440 Neoplasm of uncertain behavior of thyroid gland: Secondary | ICD-10-CM | POA: Diagnosis not present

## 2020-08-28 DIAGNOSIS — E041 Nontoxic single thyroid nodule: Secondary | ICD-10-CM | POA: Insufficient documentation

## 2020-08-29 LAB — CYTOLOGY - NON PAP

## 2020-09-17 ENCOUNTER — Encounter (HOSPITAL_COMMUNITY): Payer: Self-pay

## 2020-10-29 ENCOUNTER — Ambulatory Visit: Payer: Self-pay | Admitting: Surgery

## 2020-11-11 NOTE — Progress Notes (Addendum)
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 Patient agrees to go over the instructions while at home for a better understanding. Patient also instructed to self quarantine after being tested for COVID-19. The opportunity to ask questions was provided.

## 2020-11-11 NOTE — Patient Instructions (Signed)
DUE TO COVID-19 ONLY ONE VISITOR IS ALLOWED TO COME WITH YOU AND STAY IN THE WAITING ROOM ONLY DURING PRE OP AND PROCEDURE DAY OF SURGERY. THE 1 VISITOR  MAY VISIT WITH YOU AFTER SURGERY IN YOUR PRIVATE ROOM DURING VISITING HOURS ONLY!  YOU NEED TO HAVE A COVID 19 TEST ON_ 1-3-22______ @_______ , THIS TEST MUST BE DONE BEFORE SURGERY,  COVID TESTING SITE [address] [place] [address], IT IS ON THE RIGHT GOING OUT [address] APPROXIMATELY  2 MINUTES PAST [employer] ON THE RIGHT. ONCE YOUR COVID TEST IS COMPLETED,  PLEASE BEGIN THE QUARANTINE INSTRUCTIONS AS OUTLINED IN YOUR HANDOUT.                Shaqueria Wilmoth [facility]  11/11/2020   Your procedure is scheduled on: 11-27-20   Report to [facility] Main  Entrance   Report to admitting at       0730 AM     Call this number if you have problems the morning of surgery [phone]    Remember: Do not eat food  :After Midnight. You may have clear liquids until 0630 am then nothing by mouth    CLEAR LIQUID DIET   Foods Allowed                                                                  Black Coffee and tea, regular and decaf                             Plain Jell-O any favor except red or purple                                           Fruit ices (not with fruit pulp)                                     Iced Popsicles                                     Carbonated beverages, regular and diet                                    Cranberry, grape and apple juices Sports drinks like Gatorade Lightly seasoned clear broth or consume(fat free) Sugar, honey syrup   _____________________________________________________________________     BRUSH YOUR TEETH MORNING OF SURGERY AND RINSE YOUR MOUTH OUT, NO CHEWING GUM CANDY OR MINTS.     Take these medicines the morning of surgery with A SIP OF WATER: omeprazole                                 You may not have any metal on your body including hair  pins and  piercings  Do not wear jewelry, make-up, lotions, powders or perfumes, deodorant             Do not wear nail polish on your fingernails.  Do not shave  48 hours prior to surgery.                 Do not bring valuables to the hospital. [place].  Contacts, dentures or bridgework may not be worn into surgery.      Patients discharged the day of surgery will not be allowed to drive home. IF YOU ARE HAVING SURGERY AND GOING HOME THE SAME DAY, YOU MUST HAVE AN ADULT TO DRIVE YOU HOME AND BE WITH YOU FOR 24 HOURS. YOU MAY GO HOME BY TAXI OR UBER OR ORTHERWISE, BUT AN ADULT MUST ACCOMPANY YOU HOME AND STAY WITH YOU FOR 24 HOURS.  Name and phone number of your driver:  Special Instructions: N/A              Please read over the following fact sheets you were given: _____________________________________________________________________             [facility] - Preparing for Surgery Before surgery, you can play an important role.  Because skin is not sterile, your skin needs to be as free of germs as possible.  You can reduce the number of germs on your skin by washing with CHG (chlorahexidine gluconate) soap before surgery.  CHG is an antiseptic cleaner which kills germs and bonds with the skin to continue killing germs even after washing. Please DO NOT use if you have an allergy to CHG or antibacterial soaps.  If your skin becomes reddened/irritated stop using the CHG and inform your nurse when you arrive at Short Stay. Do not shave (including legs and underarms) for at least 48 hours prior to the first CHG shower.  You may shave your face/neck. Please follow these instructions carefully:  1.  Shower with CHG Soap the night before surgery and the  morning of Surgery.  2.  If you choose to wash your hair, wash your hair first as usual with your  normal  shampoo.  3.  After you shampoo, rinse your hair and body thoroughly to  remove the  shampoo.                           4.  Use CHG as you would any other liquid soap.  You can apply chg directly  to the skin and wash                       Gently with a scrungie or clean washcloth.  5.  Apply the CHG Soap to your body ONLY FROM THE NECK DOWN.   Do not use on face/ open                           Wound or open sores. Avoid contact with eyes, ears mouth and genitals (private parts).                       Wash face,  Genitals (private parts) with your normal soap.             6.  Wash thoroughly, paying special attention to the  area where your surgery  will be performed.  7.  Thoroughly rinse your body with warm water from the neck down.  8.  DO NOT shower/wash with your normal soap after using and rinsing off  the CHG Soap.                9.  Pat yourself dry with a clean towel.            10.  Wear clean pajamas.            11.  Place clean sheets on your bed the night of your first shower and do not  sleep with pets. Day of Surgery : Do not apply any lotions/deodorants the morning of surgery.  Please wear clean clothes to the hospital/surgery center.  FAILURE TO FOLLOW THESE INSTRUCTIONS MAY RESULT IN THE CANCELLATION OF YOUR SURGERY PATIENT SIGNATURE_________________________________  NURSE SIGNATURE__________________________________  ________________________________________________________________________

## 2020-11-17 ENCOUNTER — Encounter (HOSPITAL_COMMUNITY): Payer: Self-pay

## 2020-11-17 ENCOUNTER — Encounter (HOSPITAL_COMMUNITY)
Admission: RE | Admit: 2020-11-17 | Discharge: 2020-11-17 | Disposition: A | Discharge status: Home / Self Care | Payer: Medicare Other | Source: Ambulatory Visit | Attending: Surgery | Admitting: Surgery

## 2020-11-17 ENCOUNTER — Ambulatory Visit (HOSPITAL_COMMUNITY)
Admission: RE | Admit: 2020-11-17 | Discharge: 2020-11-17 | Disposition: A | Discharge status: Home / Self Care | Payer: Medicare Other | Source: Ambulatory Visit | Attending: Anesthesiology | Admitting: Anesthesiology

## 2020-11-17 ENCOUNTER — Other Ambulatory Visit: Payer: Self-pay

## 2020-11-17 DIAGNOSIS — Z01818 Encounter for other preprocedural examination: Secondary | ICD-10-CM

## 2020-11-17 HISTORY — DX: Family history of other specified conditions: Z84.89

## 2020-11-17 LAB — CBC
HCT: 41.3 % (ref 36.0–46.0)
Hemoglobin: 13.7 g/dL (ref 12.0–15.0)
MCH: 30.4 pg (ref 26.0–34.0)
MCHC: 33.2 g/dL (ref 30.0–36.0)
MCV: 91.8 fL (ref 80.0–100.0)
Platelets: 353 10*3/uL (ref 150–400)
RBC: 4.5 MIL/uL (ref 3.87–5.11)
RDW: 11.8 % (ref 11.5–15.5)
WBC: 6.4 10*3/uL (ref 4.0–10.5)
nRBC: 0 % (ref 0.0–0.2)

## 2020-11-17 LAB — BASIC METABOLIC PANEL
Anion gap: 6 (ref 5–15)
BUN: 10 mg/dL (ref 8–23)
CO2: 28 mmol/L (ref 22–32)
Calcium: 10.4 mg/dL — ABNORMAL HIGH (ref 8.9–10.3)
Chloride: 102 mmol/L (ref 98–111)
Creatinine, Ser: 0.58 mg/dL (ref 0.44–1.00)
GFR, Estimated: >60 mL/min (ref >60)
Glucose, Bld: 110 mg/dL — ABNORMAL HIGH (ref 70–99)
Potassium: 4.7 mmol/L (ref 3.5–5.1)
Sodium: 136 mmol/L (ref 135–145)

## 2020-11-24 ENCOUNTER — Other Ambulatory Visit (HOSPITAL_COMMUNITY)
Admission: RE | Admit: 2020-11-24 | Discharge: 2020-11-24 | Disposition: A | Discharge status: Home / Self Care | Payer: Medicare Other | Source: Ambulatory Visit | Attending: Surgery | Admitting: Surgery

## 2020-11-24 DIAGNOSIS — Z20822 Contact with and (suspected) exposure to covid-19: Secondary | ICD-10-CM | POA: Diagnosis not present

## 2020-11-24 DIAGNOSIS — Z01812 Encounter for preprocedural laboratory examination: Secondary | ICD-10-CM | POA: Insufficient documentation

## 2020-11-24 LAB — SARS CORONAVIRUS 2 (TAT 6-24 HRS): SARS Coronavirus 2: NEGATIVE

## 2020-11-26 ENCOUNTER — Encounter (HOSPITAL_COMMUNITY): Payer: Self-pay | Admitting: Surgery

## 2020-11-26 DIAGNOSIS — E042 Nontoxic multinodular goiter: Secondary | ICD-10-CM | POA: Diagnosis present

## 2020-11-26 DIAGNOSIS — D440 Neoplasm of uncertain behavior of thyroid gland: Secondary | ICD-10-CM | POA: Diagnosis present

## 2020-11-26 NOTE — H&P (Signed)
General Surgery [facility]  [place] Maria Bautista DOB: 1951/05/31 Divorced / Language: Maria Bautista / Race: White Female   History of Present Illness   The patient is a 70 year old female who presents with a thyroid nodule.  CHIEF COMPLAINT: thyroid neoplasm of uncertain behavior, multiple thyroid nodules  Patient is referred by Dr. Jacelyn Pi for surgical evaluation and management of a thyroid neoplasm of uncertain behavior. Patient's primary care provider is Maria Bautista, [place]. Patient has a greater than 20 year history of multinodular thyroid goiter. The segment been followed in [place], [place]. Patient had undergone previous fine needle aspiration biopsy. She had been followed with ultrasound and laboratory studies. Patient relocated to [place], [place]. She underwent evaluation of her thyroid including an ultrasound on August 14, 2020. This showed a mildly enlarged thyroid gland with the right lobe slightly larger than the left. A dominant nodule was noted on each side of the thyroid. Right inferior nodule measured 4.9 cm and biopsy was recommended. Left inferior nodule measured 3.8 cm and biopsy was recommended. Patient underwent fine-needle aspiration biopsy on August 28, 2020. The nodule on the left yielded scant follicular epithelium which was insufficient for evaluation, Bethesda category I. the nodule on the right demonstrated atypia of uncertain significance, Bethesda category III, and was sent for molecular genetic testing, AFIRMA. This returned with a result of suspicious, yielding a risk of malignancy of 50%. Patient is now referred to surgery for consideration for resection for definitive diagnosis and management. Patient has had no prior head or neck surgery. She has never been on thyroid medication. There is a family history of medical thyroid disease in her mother and her oldest daughter. There is no family history of thyroid cancer.  Patient presents today accompanied by her younger daughter.   Past Surgical History  Appendectomy  Breast Biopsy  Left. Cesarean Section - Multiple  Foot Surgery  Left. Hysterectomy (not due to cancer) - Complete  Knee Surgery  Left. Spinal Surgery - Lower Back  Spinal Surgery - Neck  Tonsillectomy   Diagnostic Studies History  Colonoscopy  5-10 years ago Mammogram  within last year  Allergies  Codeine Sulfate *ANALGESICS - OPIOID*  Allergies Reconciled   Medication History  Amoxicillin (500MG  Capsule, Oral) Active. Aspirin (81MG  Tablet DR, Oral) Active. PriLOSEC (20MG  Capsule DR, Oral) Active. Spironolactone (100MG  Tablet, Oral) Active. Medications Reconciled  Social History  Alcohol use  Occasional alcohol use. Caffeine use  Coffee, Tea. No drug use  Tobacco use  Never smoker.  Family History  Arthritis  Daughter, Mother. Depression  Mother. Heart Disease  Mother. Hypertension  Mother. Kidney Disease  Mother. Respiratory Condition  Father. Thyroid problems  Daughter, Mother.  Pregnancy / Birth History  Age at menarche  25 years. Gravida  5 Maternal age  50-20 Para  2  Other Problems Arthritis  Back Pain  Gastroesophageal Reflux Disease  Kidney Stone  Oophorectomy  Left. Thyroid Disease   Review of Systems  General Present- Fatigue, Night Sweats and Weight Gain. Not Present- Appetite Loss, Chills, Fever and Weight Loss. Skin Present- Dryness. Not Present- Change in Wart/Mole, Hives, Jaundice, New Lesions, Non-Healing Wounds, Rash and Ulcer. HEENT Present- Hoarseness, Seasonal Allergies and Wears glasses/contact lenses. Not Present- Earache, Hearing Loss, Nose Bleed, Oral Ulcers, Ringing in the Ears, Sinus Pain, Sore Throat, Visual Disturbances and Yellow Eyes. Cardiovascular Present- Leg Cramps and Palpitations. Not Present- Chest Pain, Difficulty Breathing Lying Down, Rapid Heart Rate, Shortness of Breath and  Swelling of Extremities. Gastrointestinal Present- Indigestion. Not Present- Abdominal Pain, Bloating, Bloody Stool, Change in Bowel Habits, Chronic diarrhea, Constipation, Difficulty Swallowing, Excessive gas, Gets full quickly at meals, Hemorrhoids, Nausea, Rectal Pain and Vomiting. Female Genitourinary Not Present- Frequency, Nocturia, Painful Urination, Pelvic Pain and Urgency. Musculoskeletal Present- Back Pain, Joint Pain and Muscle Pain. Not Present- Joint Stiffness, Muscle Weakness and Swelling of Extremities. Neurological Not Present- Decreased Memory, Fainting, Headaches, Numbness, Seizures, Tingling, Tremor, Trouble walking and Weakness. Psychiatric Not Present- Anxiety, Bipolar, Change in Sleep Pattern, Depression, Fearful and Frequent crying. Endocrine Present- Hair Changes and Heat Intolerance. Not Present- Cold Intolerance, Excessive Hunger, Hot flashes and New Diabetes. Hematology Present- Blood Thinners and Easy Bruising. Not Present- Excessive bleeding, Gland problems, HIV and Persistent Infections.  Vitals  Weight: 150.5 lb Height: 62.5in Body Surface Area: 1.7 m Body Mass Index: 27.09 kg/m  Temp.: 98.60F  Pulse: 89 (Regular)  P.OX: 96% (Room air) BP: 120/86(Sitting, Left Arm, Standard)  Physical Exam  GENERAL APPEARANCE Development: normal Nutritional status: normal Gross deformities: none  SKIN Rash, lesions, ulcers: none Induration, erythema: none Nodules: none palpable  EYES Conjunctiva and lids: normal Pupils: equal and reactive Iris: normal bilaterally  EARS, NOSE, MOUTH, THROAT External ears: no lesion or deformity External nose: no lesion or deformity Hearing: grossly normal Due to Covid-19 pandemic, patient is wearing a mask.  NECK Symmetric: no Trachea: midline Thyroid: Palpation of the right thyroid lobe shows a dominant relatively firm nodule in the inferior portion of the lobe which is nontender. Palpation on the left reveals a  softer inferior nodule which is mobile with swallowing. There is no associated lymphadenopathy.  CHEST Respiratory effort: normal Retraction or accessory muscle use: no Breath sounds: normal bilaterally Rales, rhonchi, wheeze: none  CARDIOVASCULAR Auscultation: regular rhythm, normal rate Murmurs: none Pulses: radial pulse 2+ palpable Lower extremity edema: none  MUSCULOSKELETAL Station and gait: normal Digits and nails: no clubbing or cyanosis Muscle strength: grossly normal all extremities Range of motion: grossly normal all extremities Deformity: none  LYMPHATIC Cervical: none palpable Supraclavicular: none palpable  PSYCHIATRIC Oriented to person, place, and time: yes Mood and affect: normal for situation Judgment and insight: appropriate for situation    Assessment & Plan   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  Patient provided with a copy of "The Thyroid Book: Medical and Surgical Treatment of Thyroid Problems", published by [employer], 16 pages. Book reviewed and explained to patient during visit today.  I reviewed the ultrasound report as well as the cytopathology results and molecular genetic results with the patient and her daughter today in the office. Patient has a firm palpable mass in the right lobe of the thyroid. There is a second significant nodule in the left thyroid lobe which was not adequately evaluated with fine-needle aspiration. I have recommended proceeding with total thyroidectomy for definitive diagnosis and management. We discussed the risk and benefits of the procedure including the risk of recurrent laryngeal nerve injury and injury to parathyroid glands. We discussed the size and location of the surgical incision. We discussed the hospital stay to be  anticipated. We discussed the need for lifelong thyroid hormone replacement. We discussed the potential need for radioactive iodine treatment. The patient understands and wishes to proceed with surgery in the near future.  The risks and benefits of the procedure have been discussed at  length with the patient. The patient understands the proposed procedure, potential alternative treatments, and the course of recovery to be expected. All of the patient's questions have been answered at this time. The patient wishes to proceed with surgery.   Armandina Gemma, MD [facility] Office: [phone]

## 2020-11-27 ENCOUNTER — Encounter (HOSPITAL_COMMUNITY)
Admission: RE | Disposition: A | Discharge status: Home / Self Care | Payer: Self-pay | Source: Ambulatory Visit | Attending: Surgery

## 2020-11-27 ENCOUNTER — Other Ambulatory Visit: Payer: Self-pay

## 2020-11-27 ENCOUNTER — Ambulatory Visit (HOSPITAL_COMMUNITY): Payer: Medicare Other | Admitting: Certified Registered Nurse Anesthetist

## 2020-11-27 ENCOUNTER — Encounter (HOSPITAL_COMMUNITY): Payer: Self-pay | Admitting: Surgery

## 2020-11-27 ENCOUNTER — Ambulatory Visit (HOSPITAL_COMMUNITY)
Admission: RE | Admit: 2020-11-27 | Discharge: 2020-11-28 | Disposition: A | Discharge status: Home / Self Care | Payer: Medicare Other | Source: Ambulatory Visit | Attending: Surgery | Admitting: Surgery

## 2020-11-27 DIAGNOSIS — Z7982 Long term (current) use of aspirin: Secondary | ICD-10-CM | POA: Diagnosis not present

## 2020-11-27 DIAGNOSIS — C73 Malignant neoplasm of thyroid gland: Secondary | ICD-10-CM | POA: Diagnosis not present

## 2020-11-27 DIAGNOSIS — E042 Nontoxic multinodular goiter: Secondary | ICD-10-CM | POA: Diagnosis present

## 2020-11-27 DIAGNOSIS — Z8349 Family history of other endocrine, nutritional and metabolic diseases: Secondary | ICD-10-CM | POA: Insufficient documentation

## 2020-11-27 DIAGNOSIS — Z79899 Other long term (current) drug therapy: Secondary | ICD-10-CM | POA: Insufficient documentation

## 2020-11-27 DIAGNOSIS — E041 Nontoxic single thyroid nodule: Secondary | ICD-10-CM | POA: Diagnosis not present

## 2020-11-27 DIAGNOSIS — M1990 Unspecified osteoarthritis, unspecified site: Secondary | ICD-10-CM | POA: Diagnosis not present

## 2020-11-27 DIAGNOSIS — K219 Gastro-esophageal reflux disease without esophagitis: Secondary | ICD-10-CM | POA: Diagnosis not present

## 2020-11-27 DIAGNOSIS — D440 Neoplasm of uncertain behavior of thyroid gland: Secondary | ICD-10-CM | POA: Diagnosis not present

## 2020-11-27 HISTORY — PX: THYROIDECTOMY: SHX17

## 2020-11-27 SURGERY — THYROIDECTOMY
Anesthesia: General | Site: Neck

## 2020-11-27 MED ORDER — DEXAMETHASONE SODIUM PHOSPHATE 10 MG/ML IJ SOLN
INTRAMUSCULAR | Status: DC | PRN
  Administered 2020-11-27: 5 mg via INTRAVENOUS

## 2020-11-27 MED ORDER — FENTANYL CITRATE (PF) 100 MCG/2ML IJ SOLN
25.0000 ug | INTRAMUSCULAR | Status: DC | PRN
  Administered 2020-11-27 (×3): 50 ug via INTRAVENOUS

## 2020-11-27 MED ORDER — ONDANSETRON HCL 4 MG/2ML IJ SOLN
4.0000 mg | Freq: Four times a day (QID) | INTRAMUSCULAR | Status: DC | PRN
  Administered 2020-11-27: 13:00:00 4 mg via INTRAVENOUS
  Filled 2020-11-27: qty 2

## 2020-11-27 MED ORDER — ONDANSETRON HCL 4 MG/2ML IJ SOLN
INTRAMUSCULAR | Status: AC
  Filled 2020-11-27: qty 2

## 2020-11-27 MED ORDER — ONDANSETRON 4 MG PO TBDP: 4.0000 mg | ORAL_TABLET | Freq: Four times a day (QID) | ORAL | Status: DC | PRN

## 2020-11-27 MED ORDER — FENTANYL CITRATE (PF) 100 MCG/2ML IJ SOLN
INTRAMUSCULAR | Status: AC
  Filled 2020-11-27: qty 2

## 2020-11-27 MED ORDER — PHENYLEPHRINE 40 MCG/ML (10ML) SYRINGE FOR IV PUSH (FOR BLOOD PRESSURE SUPPORT)
PREFILLED_SYRINGE | INTRAVENOUS | Status: DC | PRN
  Administered 2020-11-27 (×3): 80 ug via INTRAVENOUS

## 2020-11-27 MED ORDER — DEXAMETHASONE SODIUM PHOSPHATE 10 MG/ML IJ SOLN
INTRAMUSCULAR | Status: AC
  Filled 2020-11-27: qty 1

## 2020-11-27 MED ORDER — ONDANSETRON HCL 4 MG/2ML IJ SOLN: 4.0000 mg | Freq: Once | INTRAMUSCULAR | Status: DC | PRN

## 2020-11-27 MED ORDER — ONDANSETRON HCL 4 MG/2ML IJ SOLN
INTRAMUSCULAR | Status: DC | PRN
  Administered 2020-11-27: 4 mg via INTRAVENOUS

## 2020-11-27 MED ORDER — LIDOCAINE 2% (20 MG/ML) 5 ML SYRINGE
INTRAMUSCULAR | Status: DC | PRN
  Administered 2020-11-27: 1.5 mg/kg/h via INTRAVENOUS

## 2020-11-27 MED ORDER — LIDOCAINE 2% (20 MG/ML) 5 ML SYRINGE
INTRAMUSCULAR | Status: DC | PRN
  Administered 2020-11-27: 60 mg via INTRAVENOUS

## 2020-11-27 MED ORDER — PROPOFOL 10 MG/ML IV BOLUS
INTRAVENOUS | Status: DC | PRN
  Administered 2020-11-27: 150 mg via INTRAVENOUS
  Administered 2020-11-27: 50 mg via INTRAVENOUS

## 2020-11-27 MED ORDER — ORAL CARE MOUTH RINSE: 15.0000 mL | Freq: Once | OROMUCOSAL | Status: AC

## 2020-11-27 MED ORDER — SODIUM CHLORIDE 0.9 % IV SOLN
12.5000 mg | Freq: Four times a day (QID) | INTRAVENOUS | Status: DC | PRN
  Administered 2020-11-27 (×2): 12.5 mg via INTRAVENOUS
  Filled 2020-11-27 (×3): qty 0.5

## 2020-11-27 MED ORDER — PANTOPRAZOLE SODIUM 40 MG PO TBEC
40.0000 mg | DELAYED_RELEASE_TABLET | Freq: Every day | ORAL | Status: DC
  Administered 2020-11-28: 09:00:00 40 mg via ORAL
  Filled 2020-11-27: qty 1

## 2020-11-27 MED ORDER — CHLORHEXIDINE GLUCONATE CLOTH 2 % EX PADS: 6.0000 | MEDICATED_PAD | Freq: Once | CUTANEOUS | Status: DC

## 2020-11-27 MED ORDER — OXYCODONE HCL 5 MG/5ML PO SOLN: 5.0000 mg | Freq: Once | ORAL | Status: DC | PRN

## 2020-11-27 MED ORDER — PROMETHAZINE HCL 25 MG/ML IJ SOLN: 25.0000 mg | Freq: Four times a day (QID) | INTRAMUSCULAR | Status: DC | PRN

## 2020-11-27 MED ORDER — ROCURONIUM BROMIDE 10 MG/ML (PF) SYRINGE
PREFILLED_SYRINGE | INTRAVENOUS | Status: DC | PRN
  Administered 2020-11-27: 10 mg via INTRAVENOUS
  Administered 2020-11-27: 50 mg via INTRAVENOUS

## 2020-11-27 MED ORDER — ACETAMINOPHEN 325 MG PO TABS: 650.0000 mg | ORAL_TABLET | Freq: Four times a day (QID) | ORAL | Status: DC | PRN

## 2020-11-27 MED ORDER — 0.9 % SODIUM CHLORIDE (POUR BTL) OPTIME
TOPICAL | Status: DC | PRN
  Administered 2020-11-27: 1000 mL

## 2020-11-27 MED ORDER — TRAMADOL HCL 50 MG PO TABS
50.0000 mg | ORAL_TABLET | Freq: Four times a day (QID) | ORAL | Status: DC | PRN
  Administered 2020-11-27 – 2020-11-28 (×2): 50 mg via ORAL
  Filled 2020-11-27 (×2): qty 1

## 2020-11-27 MED ORDER — SPIRONOLACTONE 100 MG PO TABS
100.0000 mg | ORAL_TABLET | Freq: Every day | ORAL | Status: DC
Start: 2020-11-27 — End: 2020-11-28
  Administered 2020-11-28: 100 mg via ORAL
  Filled 2020-11-27 (×2): qty 1

## 2020-11-27 MED ORDER — SUGAMMADEX SODIUM 200 MG/2ML IV SOLN
INTRAVENOUS | Status: DC | PRN
  Administered 2020-11-27: 200 mg via INTRAVENOUS

## 2020-11-27 MED ORDER — SODIUM CHLORIDE 0.45 % IV SOLN: INTRAVENOUS | Status: DC

## 2020-11-27 MED ORDER — OXYCODONE HCL 5 MG PO TABS: 5.0000 mg | ORAL_TABLET | ORAL | Status: DC | PRN

## 2020-11-27 MED ORDER — ACETAMINOPHEN 650 MG RE SUPP: 650.0000 mg | Freq: Four times a day (QID) | RECTAL | Status: DC | PRN

## 2020-11-27 MED ORDER — OXYCODONE HCL 5 MG PO TABS: 5.0000 mg | ORAL_TABLET | Freq: Once | ORAL | Status: DC | PRN

## 2020-11-27 MED ORDER — CHLORHEXIDINE GLUCONATE 0.12 % MT SOLN
15.0000 mL | Freq: Once | OROMUCOSAL | Status: AC
  Administered 2020-11-27: 15 mL via OROMUCOSAL

## 2020-11-27 MED ORDER — PROPOFOL 10 MG/ML IV BOLUS
INTRAVENOUS | Status: AC
  Filled 2020-11-27: qty 20

## 2020-11-27 MED ORDER — MIDAZOLAM HCL 2 MG/2ML IJ SOLN
INTRAMUSCULAR | Status: AC
  Filled 2020-11-27: qty 2

## 2020-11-27 MED ORDER — FENTANYL CITRATE (PF) 100 MCG/2ML IJ SOLN
INTRAMUSCULAR | Status: DC | PRN
  Administered 2020-11-27 (×3): 50 ug via INTRAVENOUS

## 2020-11-27 MED ORDER — LACTATED RINGERS IV SOLN
INTRAVENOUS | Status: DC
  Administered 2020-11-27: 1000 mL via INTRAVENOUS

## 2020-11-27 MED ORDER — HYDROMORPHONE HCL 1 MG/ML IJ SOLN
1.0000 mg | INTRAMUSCULAR | Status: DC | PRN
  Administered 2020-11-27: 1 mg via INTRAVENOUS
  Filled 2020-11-27: qty 1

## 2020-11-27 MED ORDER — CEFAZOLIN SODIUM-DEXTROSE 2-4 GM/100ML-% IV SOLN
2.0000 g | INTRAVENOUS | Status: AC
  Administered 2020-11-27: 2 g via INTRAVENOUS
  Filled 2020-11-27: qty 100

## 2020-11-27 SURGICAL SUPPLY — 29 items
ATTRACTOMAT 16X20 MAGNETIC DRP (DRAPES) ×2 IMPLANT
BLADE SURG 15 STRL LF DISP TIS (BLADE) ×1 IMPLANT
BLADE SURG 15 STRL SS (BLADE) ×1
CHLORAPREP W/TINT 26 (MISCELLANEOUS) ×2 IMPLANT
CLIP VESOCCLUDE MED 6/CT (CLIP) ×4 IMPLANT
CLIP VESOCCLUDE SM WIDE 6/CT (CLIP) ×8 IMPLANT
COVER SURGICAL LIGHT HANDLE (MISCELLANEOUS) ×2 IMPLANT
COVER WAND RF STERILE (DRAPES) ×2 IMPLANT
DERMABOND ADVANCED (GAUZE/BANDAGES/DRESSINGS) ×1
DERMABOND ADVANCED .7 DNX12 (GAUZE/BANDAGES/DRESSINGS) ×1 IMPLANT
DRAPE LAPAROTOMY T 98X78 PEDS (DRAPES) ×2 IMPLANT
DRAPE UTILITY XL STRL (DRAPES) ×2 IMPLANT
ELECT PENCIL ROCKER SW 15FT (MISCELLANEOUS) ×2 IMPLANT
ELECT REM PT RETURN 15FT ADLT (MISCELLANEOUS) ×2 IMPLANT
GAUZE 4X4 16PLY RFD (DISPOSABLE) ×2 IMPLANT
GLOVE SURG ORTHO 8.0 STRL STRW (GLOVE) ×2 IMPLANT
GOWN STRL REUS W/TWL XL LVL3 (GOWN DISPOSABLE) ×4 IMPLANT
HEMOSTAT SURGICEL 2X4 FIBR (HEMOSTASIS) ×2 IMPLANT
ILLUMINATOR WAVEGUIDE N/F (MISCELLANEOUS) ×2 IMPLANT
KIT BASIN OR (CUSTOM PROCEDURE TRAY) ×2 IMPLANT
KIT TURNOVER KIT A (KITS) ×2 IMPLANT
PACK BASIC VI WITH GOWN DISP (CUSTOM PROCEDURE TRAY) ×2 IMPLANT
SHEARS HARMONIC 9CM CVD (BLADE) ×2 IMPLANT
SUT MNCRL AB 4-0 PS2 18 (SUTURE) ×2 IMPLANT
SUT VIC AB 3-0 SH 18 (SUTURE) ×4 IMPLANT
SYR BULB IRRIG 60ML STRL (SYRINGE) ×2 IMPLANT
TOWEL OR 17X26 10 PK STRL BLUE (TOWEL DISPOSABLE) ×2 IMPLANT
TOWEL OR NON WOVEN STRL DISP B (DISPOSABLE) ×2 IMPLANT
TUBING CONNECTING 10 (TUBING) ×2 IMPLANT

## 2020-11-27 NOTE — Op Note (Signed)
Procedure Note  Pre-operative Diagnosis:  Multiple thyroid nodules, thyroid neoplasm of uncertain behavior  Post-operative Diagnosis:  same  Surgeon:  Armandina Gemma, MD  Assistant:  none   Procedure:  Total thyroidectomy  Anesthesia:  General  Estimated Blood Loss:  minimal  Drains: none         Specimen: thyroid to pathology  Indications:  Patient is referred by Dr. Jacelyn Pi for surgical evaluation and management of a thyroid neoplasm of uncertain behavior. Patient's primary care provider is [name], [place]. Patient has a greater than 20 year history of multinodular thyroid goiter. The segment been followed in [place], [place]. Patient had undergone previous fine needle aspiration biopsy. She had been followed with ultrasound and laboratory studies. Patient relocated to [place], [place]. She underwent evaluation of her thyroid including an ultrasound on August 14, 2020. This showed a mildly enlarged thyroid gland with the right lobe slightly larger than the left. A dominant nodule was noted on each side of the thyroid. Right inferior nodule measured 4.9 cm and biopsy was recommended. Left inferior nodule measured 3.8 cm and biopsy was recommended. Patient underwent fine-needle aspiration biopsy on August 28, 2020. The nodule on the left yielded scant follicular epithelium which was insufficient for evaluation, Bethesda category I. the nodule on the right demonstrated atypia of uncertain significance, Bethesda category III, and was sent for molecular genetic testing, AFIRMA. This returned with a result of suspicious, yielding a risk of malignancy of 50%. Patient now comes to surgery for total thyroidectomy.  Procedure Details: Procedure was done in OR #1 at the [facility]. The patient was brought to the operating room and placed in a supine position on the operating room table. Following administration of general anesthesia, the patient was  positioned and then prepped and draped in the usual aseptic fashion. After ascertaining that an adequate level of anesthesia had been achieved, a small Kocher incision was made with #15 blade. Dissection was carried through subcutaneous tissues and platysma.Hemostasis was achieved with the electrocautery. Skin flaps were elevated cephalad and caudad from the thyroid notch to the sternal notch. A Mahorner self-retaining retractor was placed for exposure. Strap muscles were incised in the midline and dissection was begun on the left side.  Strap muscles were reflected laterally.  Left thyroid lobe was mildly enlarged and nodular.  The left lobe was gently mobilized with blunt dissection. Superior pole vessels were dissected out and divided individually between small and medium ligaclips with the harmonic scalpel. The thyroid lobe was rolled anteriorly. Branches of the inferior thyroid artery were divided between small ligaclips with the harmonic scalpel. Inferior venous tributaries were divided between ligaclips. Both the superior and inferior parathyroid glands were identified and preserved on their vascular pedicles. The recurrent laryngeal nerve was identified and preserved along its course. The ligament of Gwenlyn Found was released with the electrocautery and the gland was mobilized onto the anterior trachea. Isthmus was mobilized across the midline. There was a moderate sized pyramidal lobe present which was dissected off of the thyroid cartilage and resected en bloc with the isthmus. Dry pack was placed in the left neck.  The right thyroid lobe was gently mobilized with blunt dissection. Right thyroid lobe was moderately enlarged with a dominant mid and inferior nodule. Superior pole vessels were dissected out and divided between small and medium ligaclips with the Harmonic scalpel. Superior parathyroid was identified and preserved. Inferior venous tributaries were divided between medium ligaclips with the harmonic  scalpel. The right thyroid  lobe was rolled anteriorly and the branches of the inferior thyroid artery divided between small ligaclips. The right recurrent laryngeal nerve was identified and preserved along its course. The ligament of Allyson Sabal was released with the electrocautery. The right thyroid lobe was mobilized onto the anterior trachea and the remainder of the thyroid was dissected off the anterior trachea and the thyroid was completely excised. A suture was used to mark the left lobe. The entire thyroid gland was submitted to pathology for review.  The neck was irrigated with warm saline. Fibrillar was placed throughout the operative field. Strap muscles were approximated in the midline with interrupted 3-0 Vicryl sutures. Platysma was closed with interrupted 3-0 Vicryl sutures. Skin was closed with a running 4-0 Monocryl subcuticular suture. Wound was washed and Dermabond was applied. The patient was awakened from anesthesia and brought to the recovery room. The patient tolerated the procedure well.   Darnell Level, MD [facility] Office: [phone]

## 2020-11-27 NOTE — Anesthesia Postprocedure Evaluation (Signed)
Anesthesia Post Note  Patient: Maria Bautista  Procedure(s) Performed: TOTAL THYROIDECTOMY (N/A Neck)     Patient location during evaluation: PACU Anesthesia Type: General Level of consciousness: awake and alert Pain management: pain level controlled Vital Signs Assessment: post-procedure vital signs reviewed and stable Respiratory status: spontaneous breathing, nonlabored ventilation, respiratory function stable and patient connected to nasal cannula oxygen Cardiovascular status: blood pressure returned to baseline and stable Postop Assessment: no apparent nausea or vomiting Anesthetic complications: no   No complications documented.  Last Vitals:  Vitals:   11/27/20 1115 11/27/20 1130  BP: 119/80 140/76  Pulse: 98 79  Resp: (!) 28 (!) 9  Temp:    SpO2: 98% 96%    Last Pain:  Vitals:   11/27/20 1130  TempSrc:   PainSc: 5                  Beryle Lathe

## 2020-11-27 NOTE — Transfer of Care (Signed)
Immediate Anesthesia Transfer of Care Note  Patient: Maria Bautista  Procedure(s) Performed: TOTAL THYROIDECTOMY (N/A Neck)  Patient Location: PACU  Anesthesia Type:General  Level of Consciousness: awake, drowsy and patient cooperative  Airway & Oxygen Therapy: Patient Spontanous Breathing and Patient connected to face mask oxygen  Post-op Assessment: Report given to RN and Post -op Vital signs reviewed and stable  Post vital signs: Reviewed and stable  Last Vitals:  Vitals Value Taken Time  BP 130/69 11/27/20 1037  Temp    Pulse 91 11/27/20 1039  Resp 11 11/27/20 1039  SpO2 100 % 11/27/20 1039  Vitals shown include unvalidated device data.  Last Pain:  Vitals:   11/27/20 0746  TempSrc:   PainSc: 0-No pain         Complications: No complications documented.

## 2020-11-27 NOTE — Anesthesia Procedure Notes (Signed)
Procedure Name: Intubation Date/Time: 11/27/2020 9:00 AM Performed by: West Pugh, CRNA Pre-anesthesia Checklist: Patient identified, Emergency Drugs available, Suction available, Patient being monitored and Timeout performed Patient Re-evaluated:Patient Re-evaluated prior to induction Oxygen Delivery Method: Circle system utilized Preoxygenation: Pre-oxygenation with 100% oxygen Induction Type: IV induction Ventilation: Mask ventilation without difficulty Laryngoscope Size: 3 and Glidescope Grade View: Grade I Tube type: Oral Tube size: 7.0 mm Number of attempts: 1 Airway Equipment and Method: Video-laryngoscopy and Rigid stylet Placement Confirmation: ETT inserted through vocal cords under direct vision,  positive ETCO2,  CO2 detector and breath sounds checked- equal and bilateral Secured at: 21 cm Tube secured with: Tape Dental Injury: Teeth and Oropharynx as per pre-operative assessment  Comments: DL x 1 by CRNA with MAC 3 Grade 4. No attempt was made. DL x1 by Dr Fransisco Beau with Sabra Heck 2, epiglottis visible. No attempt made. Glidescope #3 utilized with grade 1 view and 1 attempt successful.

## 2020-11-27 NOTE — Interval H&P Note (Signed)
History and Physical Interval Note:  11/27/2020 8:28 AM  Maria Bautista  has presented today for surgery, with the diagnosis of thyroid neoplasm of uncertain behavior.  The various methods of treatment have been discussed with the patient and family. After consideration of risks, benefits and other options for treatment, the patient has consented to    Procedure(s): TOTAL THYROIDECTOMY (N/A) as a surgical intervention.    The patient's history has been reviewed, patient examined, no change in status, stable for surgery.  I have reviewed the patient's chart and labs.  Questions were answered to the patient's satisfaction.    Darnell Level, MD [facility] Office: [phone]   Darnell Level

## 2020-11-27 NOTE — Anesthesia Preprocedure Evaluation (Addendum)
Anesthesia Evaluation  Patient identified by MRN, date of birth, ID band Patient awake    Reviewed: Allergy & Precautions, NPO status , Patient's Chart, lab work & pertinent test results  History of Anesthesia Complications Negative for: history of anesthetic complications  Airway Mallampati: II  TM Distance: >3 FB Neck ROM: Full    Dental  (+) Dental Advisory Given, Teeth Intact   Pulmonary neg pulmonary ROS,    Pulmonary exam normal        Cardiovascular negative cardio ROS Normal cardiovascular exam     Neuro/Psych negative neurological ROS  negative psych ROS   GI/Hepatic Neg liver ROS, GERD  Medicated and Controlled,  Endo/Other   Thyroid neoplasm   Renal/GU negative Renal ROS     Musculoskeletal  (+) Arthritis ,   Abdominal   Peds  Hematology negative hematology ROS (+)   Anesthesia Other Findings Covid test negative   Reproductive/Obstetrics                            Anesthesia Physical Anesthesia Plan  ASA: II  Anesthesia Plan: General   Post-op Pain Management:    Induction: Intravenous  PONV Risk Score and Plan: 4 or greater and Treatment may vary due to age or medical condition, Ondansetron and Dexamethasone  Airway Management Planned: Oral ETT  Additional Equipment: None  Intra-op Plan:   Post-operative Plan: Extubation in OR  Informed Consent: I have reviewed the patients History and Physical, chart, labs and discussed the procedure including the risks, benefits and alternatives for the proposed anesthesia with the patient or authorized representative who has indicated his/her understanding and acceptance.     Dental advisory given  Plan Discussed with: CRNA and Anesthesiologist  Anesthesia Plan Comments:        Anesthesia Quick Evaluation

## 2020-11-28 ENCOUNTER — Encounter (HOSPITAL_COMMUNITY): Payer: Self-pay | Admitting: Surgery

## 2020-11-28 DIAGNOSIS — Z79899 Other long term (current) drug therapy: Secondary | ICD-10-CM | POA: Diagnosis not present

## 2020-11-28 DIAGNOSIS — Z8349 Family history of other endocrine, nutritional and metabolic diseases: Secondary | ICD-10-CM | POA: Diagnosis not present

## 2020-11-28 DIAGNOSIS — Z7982 Long term (current) use of aspirin: Secondary | ICD-10-CM | POA: Diagnosis not present

## 2020-11-28 DIAGNOSIS — C73 Malignant neoplasm of thyroid gland: Secondary | ICD-10-CM | POA: Diagnosis not present

## 2020-11-28 LAB — BASIC METABOLIC PANEL
Anion gap: 7 (ref 5–15)
BUN: 11 mg/dL (ref 8–23)
CO2: 27 mmol/L (ref 22–32)
Calcium: 10.1 mg/dL (ref 8.9–10.3)
Chloride: 98 mmol/L (ref 98–111)
Creatinine, Ser: 0.6 mg/dL (ref 0.44–1.00)
GFR, Estimated: >60 mL/min (ref >60)
Glucose, Bld: 130 mg/dL — ABNORMAL HIGH (ref 70–99)
Potassium: 4.6 mmol/L (ref 3.5–5.1)
Sodium: 132 mmol/L — ABNORMAL LOW (ref 135–145)

## 2020-11-28 MED ORDER — LEVOTHYROXINE SODIUM 88 MCG PO TABS: 88.0000 ug | ORAL_TABLET | Freq: Every day | ORAL | 3 refills | Status: AC

## 2020-11-28 MED ORDER — TRAMADOL HCL 50 MG PO TABS: 50.0000 mg | ORAL_TABLET | Freq: Four times a day (QID) | ORAL | 0 refills | Status: AC | PRN

## 2020-11-28 MED ORDER — CALCIUM CARBONATE ANTACID 500 MG PO CHEW: 2.0000 | CHEWABLE_TABLET | Freq: Two times a day (BID) | ORAL | 1 refills | Status: AC

## 2020-11-28 NOTE — Discharge Summary (Signed)
Physician Discharge Summary [facility]  Patient ID: Tabytha Gradillas MRN: [id number] DOB/AGE: March 04, 1951 70 y.o.  Admit date: 11/27/2020  Discharge date: 11/28/2020  Discharge Diagnoses:  Principal Problem:   Neoplasm of uncertain behavior of thyroid gland Active Problems:   Multiple thyroid nodules   Discharged Condition: good  Hospital Course: Patient was admitted for observation following thyroid surgery.  Post op course was uncomplicated.  Pain was well controlled.  Tolerated diet.  Post op calcium level on morning following surgery was 10.0 mg/dl.  Patient was prepared for discharge home on POD#1.  Consults: None  Treatments: surgery: total thyroidectomy  Discharge Exam: Blood pressure 113/61, pulse 81, temperature 97.9 F (36.6 C), temperature source Oral, resp. rate 17, weight 69.4 kg, SpO2 97 %. HEENT - clear Neck - wound dry and intact; mild STS; voice normal; Dermabond in place Chest - clear bilaterally Cor - RRR  Disposition: Home  Discharge Instructions    Diet - low sodium heart healthy   Complete by: As directed    Discharge instructions   Complete by: As directed    [facility]  THYROID & PARATHYROID SURGERY:  POST-OP INSTRUCTIONS  Always review your discharge instruction sheet from the facility where your surgery was performed.  A prescription for pain medication may be given to you upon discharge.  Take your pain medication as prescribed.  If narcotic pain medicine is not needed, then you may take acetaminophen (Tylenol) or ibuprofen (Advil) as needed.  Take your usually prescribed medications unless otherwise directed.  If you need a refill on your pain medication, please contact our office during regular business hours.  Prescriptions cannot be processed by our office after 5 pm or on weekends.  Start with a light diet upon arrival home, such as soup and crackers or toast.  Be sure to drink plenty of  fluids daily.  Resume your normal diet the day after surgery.  Most patients will experience some swelling and bruising on the chest and neck area.  Ice packs will help.  Swelling and bruising can take several days to resolve.   It is common to experience some constipation after surgery.  Increasing fluid intake and taking a stool softener (Colace) will usually help or prevent this problem.  A mild laxative (Milk of Magnesia or Miralax) should be taken according to package directions if there has been no bowel movement after 48 hours.  You have steri-strips and a gauze dressing over your incision.  You may remove the gauze bandage on the second day after surgery, and you may shower at that time.  Leave your steri-strips (small skin tapes) in place directly over the incision.  These strips should remain on the skin for 5-7 days and then be removed.  You may get them wet in the shower and pat them dry.  You may resume regular (light) daily activities beginning the next day (such as daily self-care, walking, climbing stairs) gradually increasing activities as tolerated.  You may have sexual intercourse when it is comfortable.  Refrain from any heavy lifting or straining until approved by your doctor.  You may drive when you no longer are taking prescription pain medication, you can comfortably wear a seatbelt, and you can safely maneuver your car and apply brakes.  You should see your doctor in the office for a follow-up appointment approximately three weeks after your surgery.  Make sure that you call for this appointment within a day or  two after you arrive home to insure a convenient appointment time.  WHEN TO CALL YOUR DOCTOR: -- Fever greater than 101.5 -- Inability to urinate -- Nausea and/or vomiting - persistent -- Extreme swelling or bruising -- Continued bleeding from incision -- Increased pain, redness, or drainage from the incision -- Difficulty swallowing or breathing -- Muscle cramping  or spasms -- Numbness or tingling in hands or around lips  The clinic staff is available to answer your questions during regular business hours.  Please don't hesitate to call and ask to speak to one of the nurses if you have concerns.  Armandina Gemma, MD [facility] Office: [phone]   Increase activity slowly   Complete by: As directed    No dressing needed   Complete by: As directed      Allergies as of 11/28/2020      Reactions   Codeine Nausea And Vomiting   Influenza Virus Vaccine    Flu like symptoms   Pneumococcal Vaccines    Swollen arm      Medication List    TAKE these medications   amoxicillin 500 MG capsule Commonly known as: AMOXIL Take 2,000 mg by mouth See admin instructions. Before dental procedures   aspirin EC 81 MG tablet Take 81 mg by mouth daily. Swallow whole.   Biotin 1000 MCG tablet Take 1,000 mcg by mouth daily.   calcium carbonate 500 MG chewable tablet Commonly known as: Tums Chew 2 tablets (400 mg of elemental calcium total) by mouth 2 (two) times daily.   Cranberry 500 MG Caps Take 500 mg by mouth daily.   diphenhydrAMINE 25 MG tablet Commonly known as: BENADRYL Take 25 mg by mouth at bedtime.   Emergen-C Immune Plus Pack Take 1 packet by mouth daily.   levothyroxine 88 MCG tablet Commonly known as: Synthroid Take 1 tablet (88 mcg total) by mouth daily before breakfast.   omeprazole 10 MG capsule Commonly known as: PRILOSEC Take 10 mg by mouth daily.   spironolactone 100 MG tablet Commonly known as: ALDACTONE TAKE 1 TABLET BY MOUTH  DAILY   traMADol 50 MG tablet Commonly known as: ULTRAM Take 1-2 tablets (50-100 mg total) by mouth every 6 (six) hours as needed.   vitamin B-12 1000 MCG tablet Commonly known as: CYANOCOBALAMIN Take 1,000 mcg by mouth daily.   Vitamin D 50 MCG (2000 UT) tablet Take 2,000 Units by mouth daily.            Discharge Care Instructions  (From admission, onward)          Start     Ordered   11/28/20 0000  No dressing needed        11/28/20 0909           Armandina Gemma, MD [facility] Office: [phone]   Signed: Armandina Gemma 11/28/2020, 9:10 AM

## 2020-11-28 NOTE — Progress Notes (Signed)
Patient was given discharge instructions, and all questions were answered.  Patient was stable at discharge and was taken to the main exit by wheelchair. 

## 2020-12-01 LAB — SURGICAL PATHOLOGY

## 2020-12-05 DIAGNOSIS — E890 Postprocedural hypothyroidism: Secondary | ICD-10-CM | POA: Diagnosis not present

## 2020-12-05 DIAGNOSIS — C73 Malignant neoplasm of thyroid gland: Secondary | ICD-10-CM | POA: Diagnosis not present

## 2020-12-11 ENCOUNTER — Other Ambulatory Visit (HOSPITAL_COMMUNITY): Payer: Self-pay | Admitting: Endocrinology

## 2020-12-11 DIAGNOSIS — C73 Malignant neoplasm of thyroid gland: Secondary | ICD-10-CM

## 2020-12-16 DIAGNOSIS — E890 Postprocedural hypothyroidism: Secondary | ICD-10-CM | POA: Diagnosis not present

## 2020-12-22 ENCOUNTER — Other Ambulatory Visit: Payer: Self-pay

## 2020-12-22 ENCOUNTER — Encounter (HOSPITAL_COMMUNITY)
Admission: RE | Admit: 2020-12-22 | Discharge: 2020-12-22 | Disposition: A | Discharge status: Home / Self Care | Payer: Medicare Other | Source: Ambulatory Visit | Attending: Endocrinology | Admitting: Endocrinology

## 2020-12-22 DIAGNOSIS — C73 Malignant neoplasm of thyroid gland: Secondary | ICD-10-CM | POA: Insufficient documentation

## 2020-12-22 MED ORDER — STERILE WATER FOR INJECTION IJ SOLN
INTRAMUSCULAR | Status: AC
  Filled 2020-12-22: qty 10

## 2020-12-22 MED ORDER — THYROTROPIN ALFA 0.9 MG IM SOLR
0.9000 mg | INTRAMUSCULAR | Status: AC
  Administered 2020-12-22: 0.9 mg via INTRAMUSCULAR

## 2020-12-22 MED ORDER — THYROTROPIN ALFA 0.9 MG IM SOLR: 0.9000 mg | INTRAMUSCULAR | Status: DC

## 2020-12-23 ENCOUNTER — Encounter (HOSPITAL_COMMUNITY)
Admission: RE | Admit: 2020-12-23 | Discharge: 2020-12-23 | Disposition: A | Discharge status: Home / Self Care | Payer: Medicare Other | Source: Ambulatory Visit | Attending: Endocrinology | Admitting: Endocrinology

## 2020-12-23 DIAGNOSIS — C73 Malignant neoplasm of thyroid gland: Secondary | ICD-10-CM | POA: Diagnosis not present

## 2020-12-23 MED ORDER — THYROTROPIN ALFA 0.9 MG IM SOLR
0.9000 mg | INTRAMUSCULAR | Status: AC
  Administered 2020-12-23: 0.9 mg via INTRAMUSCULAR

## 2020-12-24 ENCOUNTER — Encounter (HOSPITAL_COMMUNITY)
Admission: RE | Admit: 2020-12-24 | Discharge: 2020-12-24 | Disposition: A | Discharge status: Home / Self Care | Payer: Medicare Other | Source: Ambulatory Visit | Attending: Endocrinology | Admitting: Endocrinology

## 2020-12-24 ENCOUNTER — Other Ambulatory Visit: Payer: Self-pay

## 2020-12-24 DIAGNOSIS — C73 Malignant neoplasm of thyroid gland: Secondary | ICD-10-CM | POA: Diagnosis not present

## 2020-12-24 MED ORDER — SODIUM IODIDE I 131 CAPSULE
74.4000 | Freq: Once | INTRAVENOUS | Status: AC | PRN
  Administered 2020-12-24: 74.4 via ORAL

## 2020-12-31 ENCOUNTER — Other Ambulatory Visit: Payer: Self-pay

## 2020-12-31 ENCOUNTER — Encounter (HOSPITAL_COMMUNITY)
Admission: RE | Admit: 2020-12-31 | Discharge: 2020-12-31 | Disposition: A | Discharge status: Home / Self Care | Payer: Medicare Other | Source: Ambulatory Visit | Attending: Endocrinology | Admitting: Endocrinology

## 2020-12-31 DIAGNOSIS — C73 Malignant neoplasm of thyroid gland: Secondary | ICD-10-CM

## 2021-01-15 DIAGNOSIS — H5203 Hypermetropia, bilateral: Secondary | ICD-10-CM | POA: Diagnosis not present

## 2021-03-11 DIAGNOSIS — C73 Malignant neoplasm of thyroid gland: Secondary | ICD-10-CM | POA: Diagnosis not present

## 2021-03-11 DIAGNOSIS — E890 Postprocedural hypothyroidism: Secondary | ICD-10-CM | POA: Diagnosis not present

## 2021-03-13 DIAGNOSIS — C73 Malignant neoplasm of thyroid gland: Secondary | ICD-10-CM | POA: Diagnosis not present

## 2021-03-13 DIAGNOSIS — E890 Postprocedural hypothyroidism: Secondary | ICD-10-CM | POA: Diagnosis not present

## 2021-03-25 ENCOUNTER — Other Ambulatory Visit: Payer: Self-pay | Admitting: Family Medicine

## 2021-03-25 DIAGNOSIS — Z1231 Encounter for screening mammogram for malignant neoplasm of breast: Secondary | ICD-10-CM

## 2021-03-26 DIAGNOSIS — L720 Epidermal cyst: Secondary | ICD-10-CM | POA: Diagnosis not present

## 2021-05-13 ENCOUNTER — Ambulatory Visit
Admission: RE | Admit: 2021-05-13 | Discharge: 2021-05-13 | Disposition: A | Discharge status: Home / Self Care | Payer: Medicare Other | Source: Ambulatory Visit | Attending: Family Medicine | Admitting: Family Medicine

## 2021-05-13 ENCOUNTER — Other Ambulatory Visit: Payer: Self-pay

## 2021-05-13 DIAGNOSIS — Z1231 Encounter for screening mammogram for malignant neoplasm of breast: Secondary | ICD-10-CM | POA: Diagnosis not present

## 2021-07-01 DIAGNOSIS — E890 Postprocedural hypothyroidism: Secondary | ICD-10-CM | POA: Diagnosis not present

## 2021-07-30 DIAGNOSIS — E890 Postprocedural hypothyroidism: Secondary | ICD-10-CM | POA: Diagnosis not present

## 2021-07-30 DIAGNOSIS — Z85850 Personal history of malignant neoplasm of thyroid: Secondary | ICD-10-CM | POA: Diagnosis not present

## 2021-08-07 DIAGNOSIS — R7309 Other abnormal glucose: Secondary | ICD-10-CM | POA: Diagnosis not present

## 2021-08-07 DIAGNOSIS — Z0000 Encounter for general adult medical examination without abnormal findings: Secondary | ICD-10-CM | POA: Diagnosis not present

## 2021-08-07 DIAGNOSIS — E041 Nontoxic single thyroid nodule: Secondary | ICD-10-CM | POA: Diagnosis not present

## 2021-08-07 DIAGNOSIS — Z136 Encounter for screening for cardiovascular disorders: Secondary | ICD-10-CM | POA: Diagnosis not present

## 2021-09-03 DIAGNOSIS — C73 Malignant neoplasm of thyroid gland: Secondary | ICD-10-CM | POA: Diagnosis not present

## 2021-09-03 DIAGNOSIS — E890 Postprocedural hypothyroidism: Secondary | ICD-10-CM | POA: Diagnosis not present

## 2021-09-10 DIAGNOSIS — E890 Postprocedural hypothyroidism: Secondary | ICD-10-CM | POA: Diagnosis not present

## 2021-09-10 DIAGNOSIS — C73 Malignant neoplasm of thyroid gland: Secondary | ICD-10-CM | POA: Diagnosis not present

## 2021-10-14 ENCOUNTER — Other Ambulatory Visit (HOSPITAL_COMMUNITY): Payer: Self-pay | Admitting: Endocrinology

## 2021-10-14 DIAGNOSIS — C73 Malignant neoplasm of thyroid gland: Secondary | ICD-10-CM

## 2021-10-14 NOTE — Written Directive (Addendum)
MOLECULAR IMAGING AND THERAPEUTICS WRITTEN DIRECTIVE   PATIENT NAME: Maria Bautista  PT DOB:   07-24-1951                                              MRN: [id number]  ---------------------------------------------------------------------------------------------------------------------   I-131 THYROID CANCER THERAPY   RADIOPHARMACEUTICAL:  Iodine-131 Capsule    PRESCRIBED DOSE FOR ADMINISTRATION: 120 mCi    ROUTE OFADMINISTRATION:  PO   DIAGNOSIS: Papillary Thyroid Carcinoma   REFERRING PHYSICIAN: Bindubal, Balan   THYROGEN STIMULATION OR HORMONE WITHDRAW:   REMNANT ABLATION OR ADJUVANT THERAPY: BOTH   DATE OF THYROIDECTOMY:11/27/20   SURGEON:Gerkin   TSH:   Lab Results  Component Value Date   TSH 0.647 01/26/2019   TSH 0.448 (L) 12/21/2016   TSH 0.380 (L) 11/23/2016     PRIOR I-131 THERAPY (Date and Dose):   Pathology:  Cell type: [x]   Papillary  []   Follicular  []   [place]   Largest tumor focus:    4  cm  - Multifocal  Extrathyroidal Extension?     Yes []   No [x]     Lymphovascular Invasion?  Yes  []   No  [x]     Margins positive ? Yes []   No [x]     Lymph nodes positive? Yes []   No  []       # positive nodes:  0 # negative nodes:  0  x TNM staging: pT:   2      PN:     x    Mx:    ADDITIONAL PHYSICIAN COMMENTS/NOTES - Intermediate to high risk owing to size of tumor 4 cm   AUTHORIZED USER SIGNATURE & TIME STAMP: Rennis Golden, MD   10/14/21    9:56 AM

## 2021-10-19 NOTE — Written Directive (Addendum)
MOLECULAR IMAGING AND THERAPEUTICS WRITTEN DIRECTIVE   PATIENT NAME: Maria Bautista  PT DOB:   [birth year]                                              MRN: [id number]  ---------------------------------------------------------------------------------------------------------------------  I-131 WHOLE BODY SCAN    RADIOPHARMACEUTICAL: Iodine-131 Capsule for Diagnostic Imaging   PRESCRIBED DOSE FOR ADMINISTRATION: 4 mCi   ROUTE OFADMINISTRATION: PO   DIAGNOSIS: Papillary Thyroid Carcinoma   REFERRING PHYSICIAN: Bindubal Balan   THYROGEN STIMULATION OR HORMONE WITHDRAW: Thyrogen   DATE OF THYROIDECTOMY: 11/27/2020   SURGEON: Armandina Gemma   TSH:   Lab Results  Component Value Date   TSH 0.647 01/26/2019   TSH 0.448 (L) 12/21/2016   TSH 0.380 (L) 11/23/2016     PRIOR I-131 THERAPY (Date and Dose):   ADDITIONAL PHYSICIAN COMMENTS/NOTES   AUTHORIZED USER SIGNATURE & TIME STAMP: Rennis Golden, MD   10/19/21    1:57 PM

## 2021-11-05 DIAGNOSIS — D1801 Hemangioma of skin and subcutaneous tissue: Secondary | ICD-10-CM | POA: Diagnosis not present

## 2021-11-05 DIAGNOSIS — L821 Other seborrheic keratosis: Secondary | ICD-10-CM | POA: Diagnosis not present

## 2021-11-05 DIAGNOSIS — Z23 Encounter for immunization: Secondary | ICD-10-CM | POA: Diagnosis not present

## 2021-11-05 DIAGNOSIS — L814 Other melanin hyperpigmentation: Secondary | ICD-10-CM | POA: Diagnosis not present

## 2021-11-05 DIAGNOSIS — L578 Other skin changes due to chronic exposure to nonionizing radiation: Secondary | ICD-10-CM | POA: Diagnosis not present

## 2021-11-09 ENCOUNTER — Encounter (HOSPITAL_COMMUNITY)
Admission: RE | Admit: 2021-11-09 | Discharge: 2021-11-09 | Disposition: A | Discharge status: Home / Self Care | Payer: Medicare Other | Source: Ambulatory Visit | Attending: Endocrinology | Admitting: Endocrinology

## 2021-11-09 ENCOUNTER — Other Ambulatory Visit: Payer: Self-pay

## 2021-11-09 ENCOUNTER — Inpatient Hospital Stay (HOSPITAL_COMMUNITY)
Admission: RE | Admit: 2021-11-09 | Discharge: 2021-11-09 | Disposition: A | Discharge status: Home / Self Care | Payer: Medicare Other | Source: Ambulatory Visit | Attending: Endocrinology | Admitting: Endocrinology

## 2021-11-09 ENCOUNTER — Ambulatory Visit (HOSPITAL_COMMUNITY): Payer: Medicare Other

## 2021-11-09 DIAGNOSIS — C73 Malignant neoplasm of thyroid gland: Secondary | ICD-10-CM | POA: Insufficient documentation

## 2021-11-09 MED ORDER — STERILE WATER FOR INJECTION IJ SOLN: 1.0000 mL | Freq: Once | INTRAMUSCULAR | Status: AC

## 2021-11-09 MED ORDER — THYROTROPIN ALFA 0.9 MG IM SOLR
0.9000 mg | INTRAMUSCULAR | Status: AC
  Administered 2021-11-09: 11:00:00 0.9 mg via INTRAMUSCULAR

## 2021-11-09 MED ORDER — STERILE WATER FOR INJECTION IJ SOLN
INTRAMUSCULAR | Status: AC
  Administered 2021-11-09: 11:00:00 1 mL via INTRAMUSCULAR
  Filled 2021-11-09: qty 10

## 2021-11-10 ENCOUNTER — Ambulatory Visit (HOSPITAL_COMMUNITY): Payer: Medicare Other

## 2021-11-10 ENCOUNTER — Encounter (HOSPITAL_COMMUNITY)
Admission: RE | Admit: 2021-11-10 | Discharge: 2021-11-10 | Disposition: A | Discharge status: Home / Self Care | Payer: Medicare Other | Source: Ambulatory Visit | Attending: Endocrinology | Admitting: Endocrinology

## 2021-11-10 DIAGNOSIS — C73 Malignant neoplasm of thyroid gland: Secondary | ICD-10-CM | POA: Diagnosis not present

## 2021-11-10 MED ORDER — THYROTROPIN ALFA 0.9 MG IM SOLR
0.9000 mg | INTRAMUSCULAR | Status: AC
  Administered 2021-11-10: 11:00:00 0.9 mg via INTRAMUSCULAR

## 2021-11-11 ENCOUNTER — Other Ambulatory Visit: Payer: Self-pay

## 2021-11-11 ENCOUNTER — Ambulatory Visit (HOSPITAL_COMMUNITY): Payer: Medicare Other

## 2021-11-11 ENCOUNTER — Encounter (HOSPITAL_COMMUNITY)
Admission: RE | Admit: 2021-11-11 | Discharge: 2021-11-11 | Disposition: A | Discharge status: Home / Self Care | Payer: Medicare Other | Source: Ambulatory Visit | Attending: Endocrinology | Admitting: Endocrinology

## 2021-11-13 ENCOUNTER — Encounter (HOSPITAL_COMMUNITY)
Admission: RE | Admit: 2021-11-13 | Discharge: 2021-11-13 | Disposition: A | Discharge status: Home / Self Care | Payer: Medicare Other | Source: Ambulatory Visit | Attending: Endocrinology | Admitting: Endocrinology

## 2021-11-13 ENCOUNTER — Other Ambulatory Visit: Payer: Self-pay

## 2021-11-13 DIAGNOSIS — C73 Malignant neoplasm of thyroid gland: Secondary | ICD-10-CM | POA: Diagnosis not present

## 2021-11-13 MED ORDER — SODIUM IODIDE I 131 CAPSULE
3.8000 | Freq: Once | INTRAVENOUS | Status: AC | PRN
  Administered 2021-11-13: 14:00:00 3.8 via ORAL

## 2022-01-02 IMAGING — MG MM DIGITAL SCREENING BILAT W/ TOMO AND CAD
8 series · 8 of 24 positions shown · non-contrast
Comparison: Previous exam(s).

CLINICAL DATA: Screening.

EXAM:
DIGITAL SCREENING BILATERAL MAMMOGRAM WITH TOMOSYNTHESIS AND CAD
TECHNIQUE: Bilateral screening digital craniocaudal and mediolateral oblique
mammograms were obtained. Bilateral screening digital breast
tomosynthesis was performed. The images were evaluated with
computer-aided detection.

[R CC synth-2D]
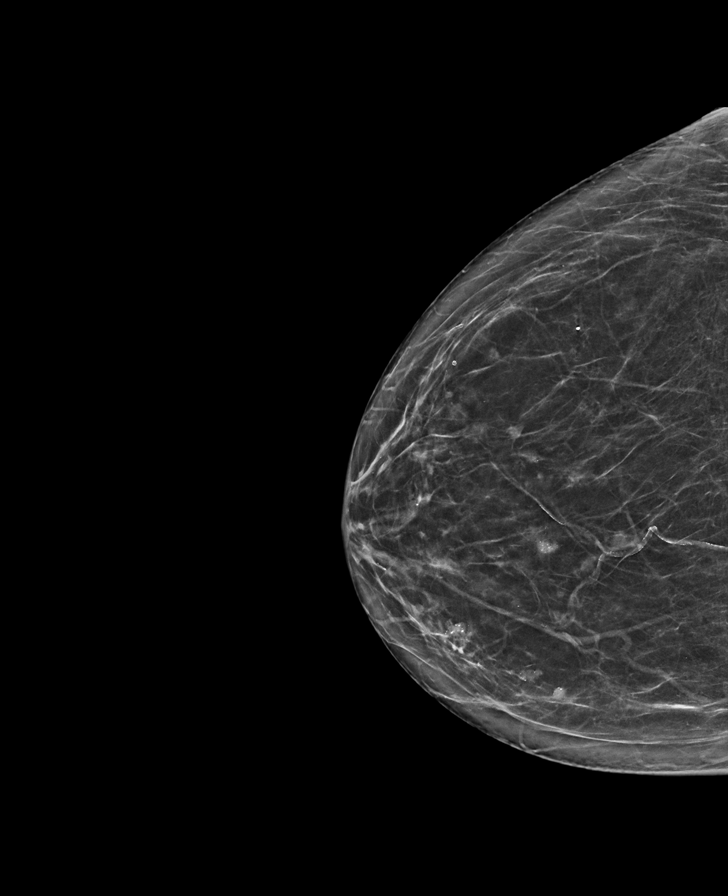

[L CC synth-2D]
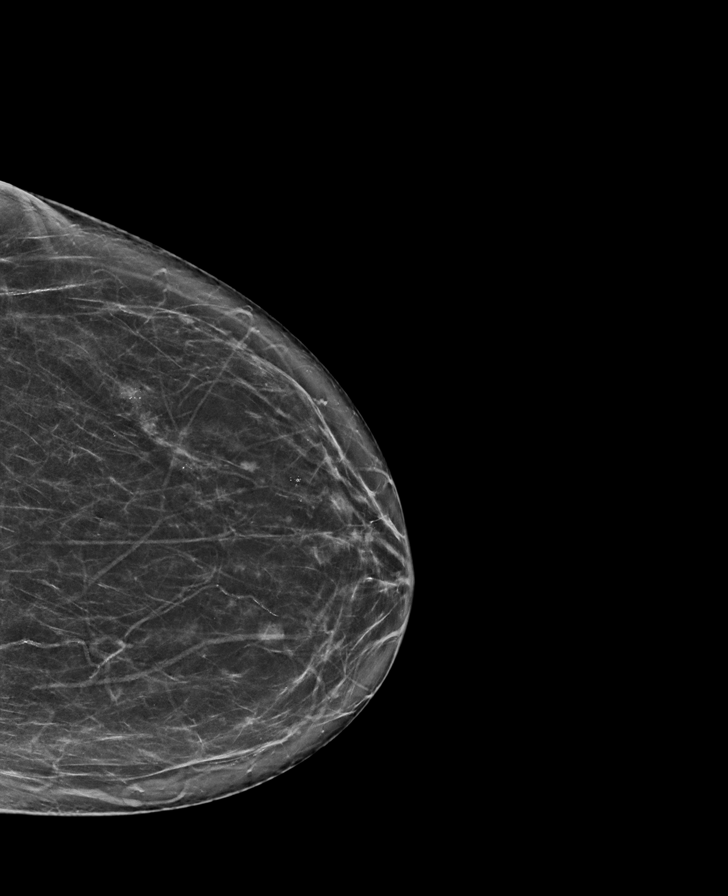

[R MLO synth-2D]
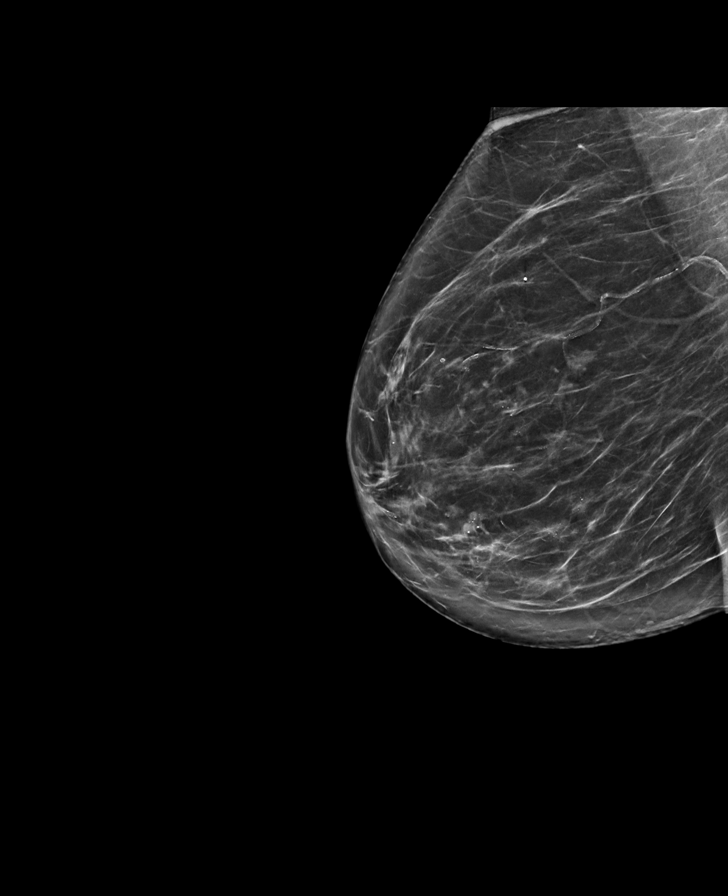

[L MLO synth-2D]
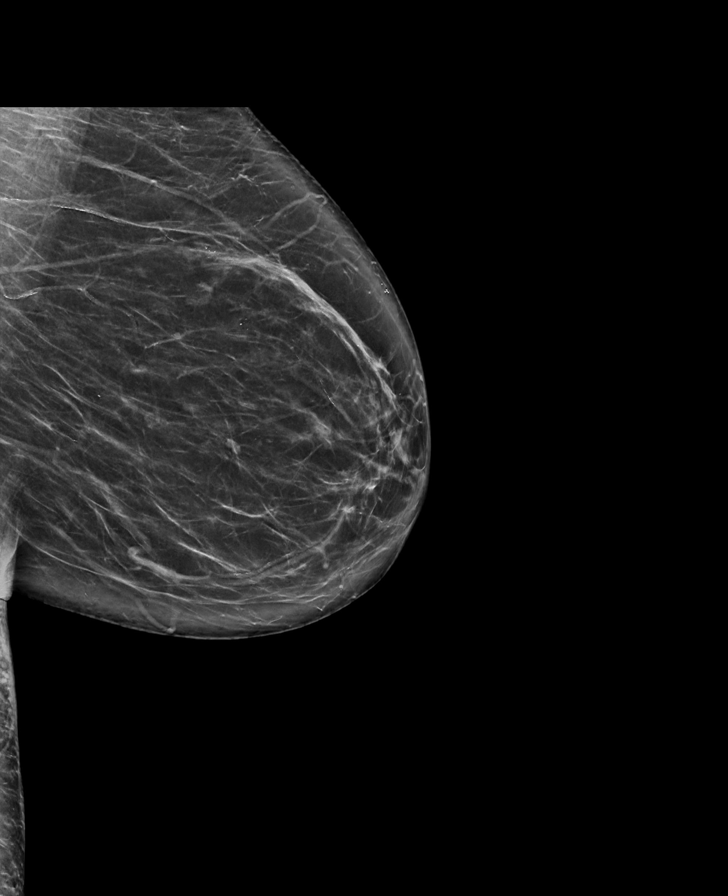

[L MLO tomo · tomo slice 39/78.0]
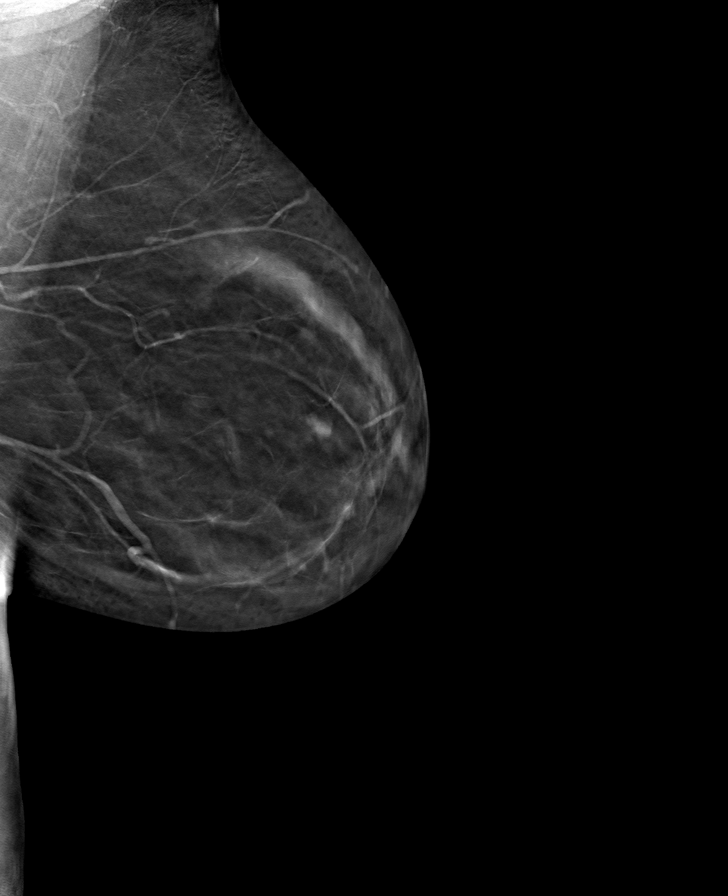

[L CC tomo · tomo slice 37/72.0]
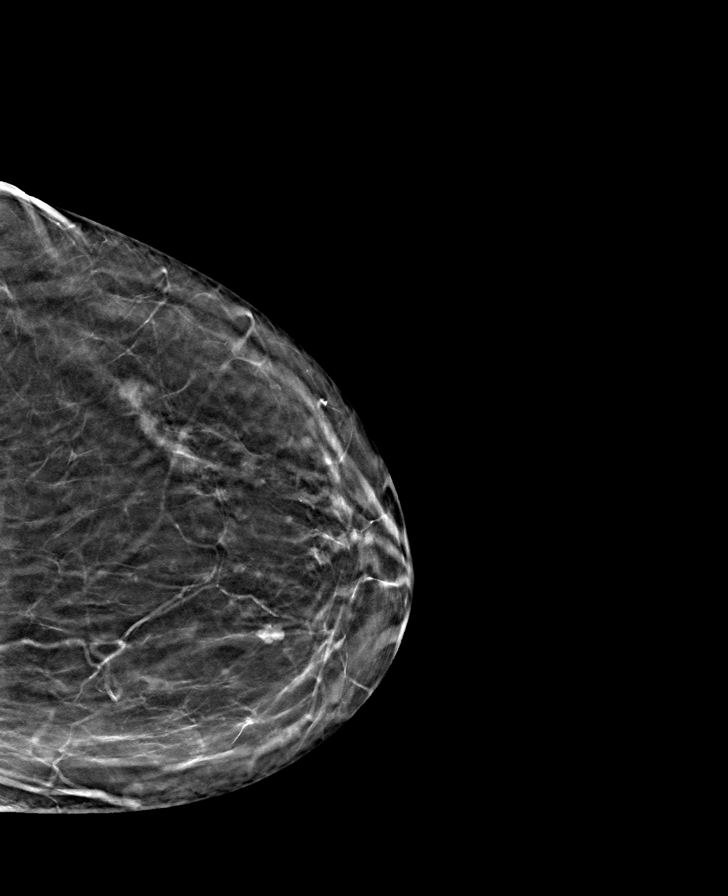

[R CC tomo · tomo slice 35/69.0]
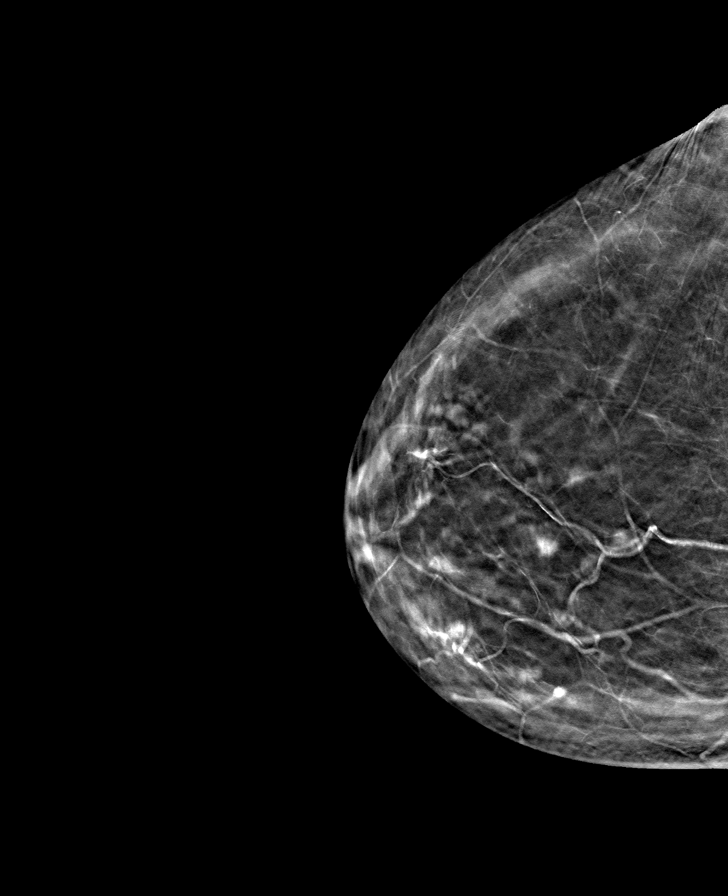

[R MLO tomo · tomo slice 40/79.0]
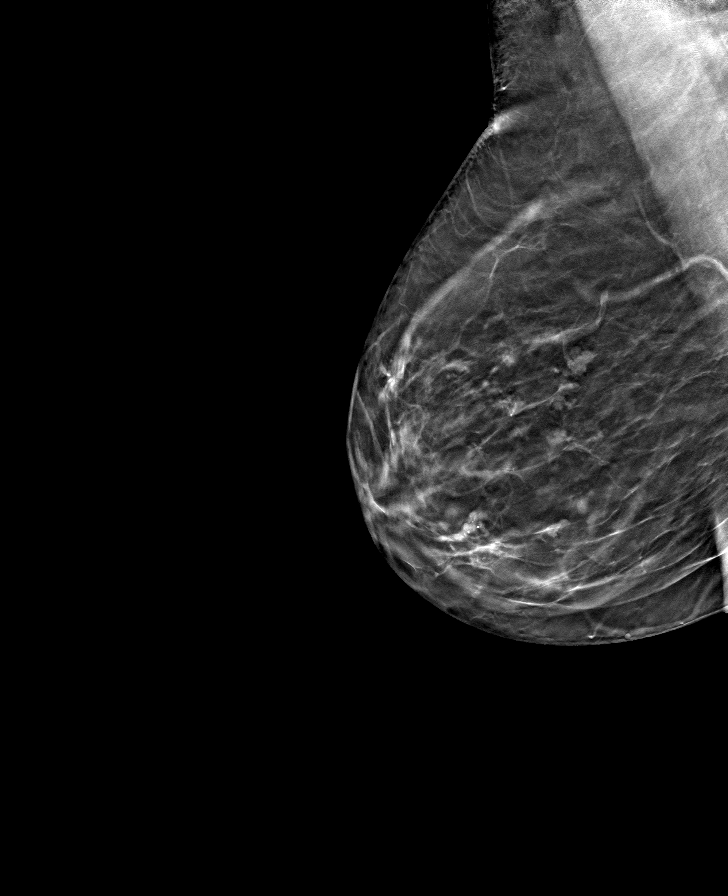

[8 of 24 positions shown; findings below may reference images not displayed]

ACR Breast Density Category b: There are scattered areas of
fibroglandular density.
FINDINGS: There are no findings suspicious for malignancy.
IMPRESSION: No mammographic evidence of malignancy. A result letter of this
screening mammogram will be mailed directly to the patient.

RECOMMENDATION:
Screening mammogram in one year. (Code:51-O-LD2)

BI-RADS CATEGORY  1: Negative.

## 2022-03-05 DIAGNOSIS — H2513 Age-related nuclear cataract, bilateral: Secondary | ICD-10-CM | POA: Diagnosis not present

## 2022-03-11 DIAGNOSIS — R5383 Other fatigue: Secondary | ICD-10-CM | POA: Diagnosis not present

## 2022-03-11 DIAGNOSIS — D649 Anemia, unspecified: Secondary | ICD-10-CM | POA: Diagnosis not present

## 2022-03-11 DIAGNOSIS — E041 Nontoxic single thyroid nodule: Secondary | ICD-10-CM | POA: Diagnosis not present

## 2022-03-11 DIAGNOSIS — J301 Allergic rhinitis due to pollen: Secondary | ICD-10-CM | POA: Diagnosis not present

## 2022-03-30 ENCOUNTER — Other Ambulatory Visit: Payer: Self-pay | Admitting: Family Medicine

## 2022-03-30 DIAGNOSIS — Z1231 Encounter for screening mammogram for malignant neoplasm of breast: Secondary | ICD-10-CM

## 2022-04-08 DIAGNOSIS — D649 Anemia, unspecified: Secondary | ICD-10-CM | POA: Diagnosis not present

## 2022-05-19 ENCOUNTER — Ambulatory Visit
Admission: RE | Admit: 2022-05-19 | Discharge: 2022-05-19 | Disposition: A | Discharge status: Home / Self Care | Payer: Medicare Other | Source: Ambulatory Visit | Attending: Family Medicine | Admitting: Family Medicine

## 2022-05-19 DIAGNOSIS — Z1231 Encounter for screening mammogram for malignant neoplasm of breast: Secondary | ICD-10-CM | POA: Diagnosis not present

## 2022-08-16 DIAGNOSIS — Z23 Encounter for immunization: Secondary | ICD-10-CM | POA: Diagnosis not present

## 2022-08-16 DIAGNOSIS — E890 Postprocedural hypothyroidism: Secondary | ICD-10-CM | POA: Diagnosis not present

## 2022-08-16 DIAGNOSIS — Z0000 Encounter for general adult medical examination without abnormal findings: Secondary | ICD-10-CM | POA: Diagnosis not present

## 2022-08-16 DIAGNOSIS — E041 Nontoxic single thyroid nodule: Secondary | ICD-10-CM | POA: Diagnosis not present

## 2022-08-16 DIAGNOSIS — Z1159 Encounter for screening for other viral diseases: Secondary | ICD-10-CM | POA: Diagnosis not present

## 2022-08-16 DIAGNOSIS — R7309 Other abnormal glucose: Secondary | ICD-10-CM | POA: Diagnosis not present

## 2022-08-16 DIAGNOSIS — Z136 Encounter for screening for cardiovascular disorders: Secondary | ICD-10-CM | POA: Diagnosis not present

## 2022-08-16 DIAGNOSIS — Z13220 Encounter for screening for lipoid disorders: Secondary | ICD-10-CM | POA: Diagnosis not present

## 2022-08-16 DIAGNOSIS — B351 Tinea unguium: Secondary | ICD-10-CM | POA: Diagnosis not present

## 2022-08-16 DIAGNOSIS — Z1321 Encounter for screening for nutritional disorder: Secondary | ICD-10-CM | POA: Diagnosis not present

## 2022-08-16 DIAGNOSIS — S93492A Sprain of other ligament of left ankle, initial encounter: Secondary | ICD-10-CM | POA: Diagnosis not present

## 2022-09-10 DIAGNOSIS — E890 Postprocedural hypothyroidism: Secondary | ICD-10-CM | POA: Diagnosis not present

## 2022-09-10 DIAGNOSIS — C73 Malignant neoplasm of thyroid gland: Secondary | ICD-10-CM | POA: Diagnosis not present

## 2022-09-17 DIAGNOSIS — C73 Malignant neoplasm of thyroid gland: Secondary | ICD-10-CM | POA: Diagnosis not present

## 2022-09-17 DIAGNOSIS — E890 Postprocedural hypothyroidism: Secondary | ICD-10-CM | POA: Diagnosis not present

## 2022-09-23 DIAGNOSIS — E890 Postprocedural hypothyroidism: Secondary | ICD-10-CM | POA: Diagnosis not present

## 2022-09-23 DIAGNOSIS — Z85850 Personal history of malignant neoplasm of thyroid: Secondary | ICD-10-CM | POA: Diagnosis not present

## 2022-11-04 DIAGNOSIS — L814 Other melanin hyperpigmentation: Secondary | ICD-10-CM | POA: Diagnosis not present

## 2022-11-04 DIAGNOSIS — L578 Other skin changes due to chronic exposure to nonionizing radiation: Secondary | ICD-10-CM | POA: Diagnosis not present

## 2022-11-04 DIAGNOSIS — L821 Other seborrheic keratosis: Secondary | ICD-10-CM | POA: Diagnosis not present

## 2022-11-04 DIAGNOSIS — D1801 Hemangioma of skin and subcutaneous tissue: Secondary | ICD-10-CM | POA: Diagnosis not present

## 2023-02-15 DIAGNOSIS — H2513 Age-related nuclear cataract, bilateral: Secondary | ICD-10-CM | POA: Diagnosis not present

## 2023-02-15 DIAGNOSIS — H5203 Hypermetropia, bilateral: Secondary | ICD-10-CM | POA: Diagnosis not present

## 2023-02-15 DIAGNOSIS — H52223 Regular astigmatism, bilateral: Secondary | ICD-10-CM | POA: Diagnosis not present

## 2023-02-15 DIAGNOSIS — H43391 Other vitreous opacities, right eye: Secondary | ICD-10-CM | POA: Diagnosis not present

## 2023-03-07 DIAGNOSIS — M79675 Pain in left toe(s): Secondary | ICD-10-CM | POA: Diagnosis not present

## 2023-04-06 ENCOUNTER — Other Ambulatory Visit: Payer: Self-pay | Admitting: Family Medicine

## 2023-04-06 DIAGNOSIS — Z1231 Encounter for screening mammogram for malignant neoplasm of breast: Secondary | ICD-10-CM

## 2023-05-31 ENCOUNTER — Ambulatory Visit
Admission: RE | Admit: 2023-05-31 | Discharge: 2023-05-31 | Disposition: A | Discharge status: Home / Self Care | Payer: Medicare Other | Source: Ambulatory Visit | Attending: Family Medicine | Admitting: Family Medicine

## 2023-05-31 DIAGNOSIS — Z1231 Encounter for screening mammogram for malignant neoplasm of breast: Secondary | ICD-10-CM

## 2023-08-08 ENCOUNTER — Other Ambulatory Visit: Payer: Self-pay | Admitting: Endocrinology

## 2023-08-08 DIAGNOSIS — C73 Malignant neoplasm of thyroid gland: Secondary | ICD-10-CM

## 2023-08-23 DIAGNOSIS — Z13220 Encounter for screening for lipoid disorders: Secondary | ICD-10-CM | POA: Diagnosis not present

## 2023-08-23 DIAGNOSIS — Z0000 Encounter for general adult medical examination without abnormal findings: Secondary | ICD-10-CM | POA: Diagnosis not present

## 2023-08-23 DIAGNOSIS — E041 Nontoxic single thyroid nodule: Secondary | ICD-10-CM | POA: Diagnosis not present

## 2023-08-23 DIAGNOSIS — Z136 Encounter for screening for cardiovascular disorders: Secondary | ICD-10-CM | POA: Diagnosis not present

## 2023-08-23 DIAGNOSIS — R7309 Other abnormal glucose: Secondary | ICD-10-CM | POA: Diagnosis not present

## 2023-08-23 DIAGNOSIS — E890 Postprocedural hypothyroidism: Secondary | ICD-10-CM | POA: Diagnosis not present

## 2023-08-24 ENCOUNTER — Other Ambulatory Visit: Payer: Medicare Other

## 2023-09-05 ENCOUNTER — Ambulatory Visit
Admission: RE | Admit: 2023-09-05 | Discharge: 2023-09-05 | Disposition: A | Discharge status: Home / Self Care | Payer: Medicare Other | Source: Ambulatory Visit | Attending: Endocrinology | Admitting: Endocrinology

## 2023-09-05 DIAGNOSIS — C73 Malignant neoplasm of thyroid gland: Secondary | ICD-10-CM

## 2023-09-05 DIAGNOSIS — Z85850 Personal history of malignant neoplasm of thyroid: Secondary | ICD-10-CM | POA: Diagnosis not present

## 2023-09-09 DIAGNOSIS — Z1211 Encounter for screening for malignant neoplasm of colon: Secondary | ICD-10-CM | POA: Diagnosis not present

## 2023-09-09 DIAGNOSIS — Z1212 Encounter for screening for malignant neoplasm of rectum: Secondary | ICD-10-CM | POA: Diagnosis not present

## 2023-09-17 LAB — COLOGUARD: COLOGUARD: NEGATIVE

## 2023-09-19 DIAGNOSIS — C73 Malignant neoplasm of thyroid gland: Secondary | ICD-10-CM | POA: Diagnosis not present

## 2023-09-19 DIAGNOSIS — E890 Postprocedural hypothyroidism: Secondary | ICD-10-CM | POA: Diagnosis not present

## 2023-09-26 DIAGNOSIS — C73 Malignant neoplasm of thyroid gland: Secondary | ICD-10-CM | POA: Diagnosis not present

## 2023-09-26 DIAGNOSIS — E890 Postprocedural hypothyroidism: Secondary | ICD-10-CM | POA: Diagnosis not present

## 2024-02-07 DIAGNOSIS — H2513 Age-related nuclear cataract, bilateral: Secondary | ICD-10-CM | POA: Diagnosis not present

## 2024-02-27 DIAGNOSIS — J301 Allergic rhinitis due to pollen: Secondary | ICD-10-CM | POA: Diagnosis not present

## 2024-04-17 ENCOUNTER — Other Ambulatory Visit: Payer: Self-pay | Admitting: Family Medicine

## 2024-04-17 DIAGNOSIS — Z1231 Encounter for screening mammogram for malignant neoplasm of breast: Secondary | ICD-10-CM

## 2024-05-31 ENCOUNTER — Ambulatory Visit

## 2024-06-20 ENCOUNTER — Ambulatory Visit

## 2024-06-26 ENCOUNTER — Ambulatory Visit
Admission: RE | Admit: 2024-06-26 | Discharge: 2024-06-26 | Disposition: A | Discharge status: Home / Self Care | Source: Ambulatory Visit | Attending: Family Medicine | Admitting: Family Medicine

## 2024-06-26 DIAGNOSIS — Z1231 Encounter for screening mammogram for malignant neoplasm of breast: Secondary | ICD-10-CM | POA: Diagnosis not present

## 2024-06-28 DIAGNOSIS — M1711 Unilateral primary osteoarthritis, right knee: Secondary | ICD-10-CM | POA: Diagnosis not present

## 2024-06-28 DIAGNOSIS — M810 Age-related osteoporosis without current pathological fracture: Secondary | ICD-10-CM | POA: Diagnosis not present

## 2024-06-28 DIAGNOSIS — E041 Nontoxic single thyroid nodule: Secondary | ICD-10-CM | POA: Diagnosis not present

## 2024-06-29 ENCOUNTER — Other Ambulatory Visit: Payer: Self-pay | Admitting: Family Medicine

## 2024-06-29 DIAGNOSIS — N6310 Unspecified lump in the right breast, unspecified quadrant: Secondary | ICD-10-CM

## 2024-06-29 DIAGNOSIS — M1711 Unilateral primary osteoarthritis, right knee: Secondary | ICD-10-CM | POA: Diagnosis not present

## 2024-07-03 ENCOUNTER — Ambulatory Visit
Admission: RE | Admit: 2024-07-03 | Discharge: 2024-07-03 | Disposition: A | Discharge status: Home / Self Care | Source: Ambulatory Visit | Attending: Family Medicine | Admitting: Family Medicine

## 2024-07-03 DIAGNOSIS — N6311 Unspecified lump in the right breast, upper outer quadrant: Secondary | ICD-10-CM | POA: Diagnosis not present

## 2024-07-03 DIAGNOSIS — R928 Other abnormal and inconclusive findings on diagnostic imaging of breast: Secondary | ICD-10-CM | POA: Diagnosis not present

## 2024-07-03 DIAGNOSIS — R92321 Mammographic fibroglandular density, right breast: Secondary | ICD-10-CM | POA: Diagnosis not present

## 2024-07-03 DIAGNOSIS — N6310 Unspecified lump in the right breast, unspecified quadrant: Secondary | ICD-10-CM

## 2024-08-09 DIAGNOSIS — M810 Age-related osteoporosis without current pathological fracture: Secondary | ICD-10-CM | POA: Diagnosis not present

## 2024-08-09 DIAGNOSIS — M8589 Other specified disorders of bone density and structure, multiple sites: Secondary | ICD-10-CM | POA: Diagnosis not present

## 2024-09-12 DIAGNOSIS — E890 Postprocedural hypothyroidism: Secondary | ICD-10-CM | POA: Diagnosis not present

## 2024-09-12 DIAGNOSIS — Z0000 Encounter for general adult medical examination without abnormal findings: Secondary | ICD-10-CM | POA: Diagnosis not present

## 2024-09-12 DIAGNOSIS — Z13220 Encounter for screening for lipoid disorders: Secondary | ICD-10-CM | POA: Diagnosis not present

## 2024-09-12 DIAGNOSIS — Z136 Encounter for screening for cardiovascular disorders: Secondary | ICD-10-CM | POA: Diagnosis not present

## 2024-09-12 DIAGNOSIS — E041 Nontoxic single thyroid nodule: Secondary | ICD-10-CM | POA: Diagnosis not present

## 2024-09-12 DIAGNOSIS — M810 Age-related osteoporosis without current pathological fracture: Secondary | ICD-10-CM | POA: Diagnosis not present

## 2024-09-12 DIAGNOSIS — J301 Allergic rhinitis due to pollen: Secondary | ICD-10-CM | POA: Diagnosis not present

## 2024-09-12 DIAGNOSIS — M1711 Unilateral primary osteoarthritis, right knee: Secondary | ICD-10-CM | POA: Diagnosis not present

## 2024-09-12 DIAGNOSIS — R7309 Other abnormal glucose: Secondary | ICD-10-CM | POA: Diagnosis not present
